# Patient Record
Sex: Male | Born: 1970 | Race: White | Hispanic: No | Marital: Married | Smoking: Never smoker
Health system: Southern US, Community
[De-identification: ages and names within clinical notes are randomized; demographics above are authoritative.]

## PROBLEM LIST (undated history)

## (undated) DIAGNOSIS — F32A Depression, unspecified: Secondary | ICD-10-CM

## (undated) DIAGNOSIS — K219 Gastro-esophageal reflux disease without esophagitis: Secondary | ICD-10-CM

## (undated) DIAGNOSIS — M2550 Pain in unspecified joint: Secondary | ICD-10-CM

## (undated) DIAGNOSIS — M549 Dorsalgia, unspecified: Secondary | ICD-10-CM

## (undated) DIAGNOSIS — R7303 Prediabetes: Secondary | ICD-10-CM

## (undated) HISTORY — DX: Depression, unspecified: F32.A

## (undated) HISTORY — DX: Dorsalgia, unspecified: M54.9

## (undated) HISTORY — DX: Gastro-esophageal reflux disease without esophagitis: K21.9

## (undated) HISTORY — PX: SHOULDER SURGERY: SHX246

## (undated) HISTORY — DX: Prediabetes: R73.03

## (undated) HISTORY — DX: Pain in unspecified joint: M25.50

---
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
## 1999-05-23 ENCOUNTER — Encounter: Payer: Self-pay | Admitting: Neurosurgery

## 1999-05-23 ENCOUNTER — Ambulatory Visit (HOSPITAL_COMMUNITY): Admission: RE | Admit: 1999-05-23 | Discharge: 1999-05-23 | Payer: Self-pay | Admitting: Neurosurgery

## 1999-06-07 ENCOUNTER — Encounter: Payer: Self-pay | Admitting: Neurosurgery

## 1999-06-07 ENCOUNTER — Ambulatory Visit (HOSPITAL_COMMUNITY): Admission: RE | Admit: 1999-06-07 | Discharge: 1999-06-07 | Payer: Self-pay | Admitting: Neurosurgery

## 2003-10-03 HISTORY — PX: ROTATOR CUFF REPAIR: SHX139

## 2007-10-03 HISTORY — PX: KNEE SURGERY: SHX244

## 2008-05-20 ENCOUNTER — Ambulatory Visit: Payer: Self-pay | Admitting: Orthopaedic Surgery

## 2008-06-23 DIAGNOSIS — H539 Unspecified visual disturbance: Secondary | ICD-10-CM | POA: Insufficient documentation

## 2008-06-23 DIAGNOSIS — R519 Headache, unspecified: Secondary | ICD-10-CM | POA: Insufficient documentation

## 2008-06-29 ENCOUNTER — Ambulatory Visit: Payer: Self-pay

## 2008-07-03 ENCOUNTER — Ambulatory Visit: Payer: Self-pay | Admitting: Orthopedic Surgery

## 2008-07-21 ENCOUNTER — Ambulatory Visit: Payer: Self-pay

## 2010-03-03 DIAGNOSIS — R5381 Other malaise: Secondary | ICD-10-CM | POA: Insufficient documentation

## 2010-03-03 DIAGNOSIS — M6281 Muscle weakness (generalized): Secondary | ICD-10-CM | POA: Insufficient documentation

## 2010-04-23 IMAGING — CT CT HEAD WITHOUT CONTRAST
2 series · 16 of 30 positions shown, 20 images · non-contrast
Comparison: none

REASON FOR EXAM: headaches blurred vision
COMMENTS:

[Series 2: without · axial · non-contrast · 0.39mm/px · z∈[-66,+54]mm · 13 of 29 slices shown, 17 images]
[im 3/29  brain]
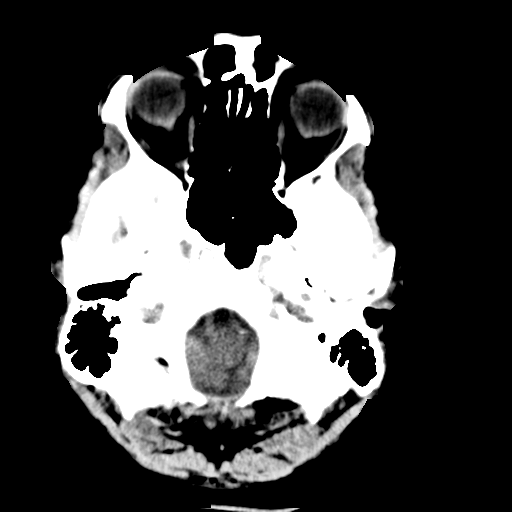
[im 3/29  bone]
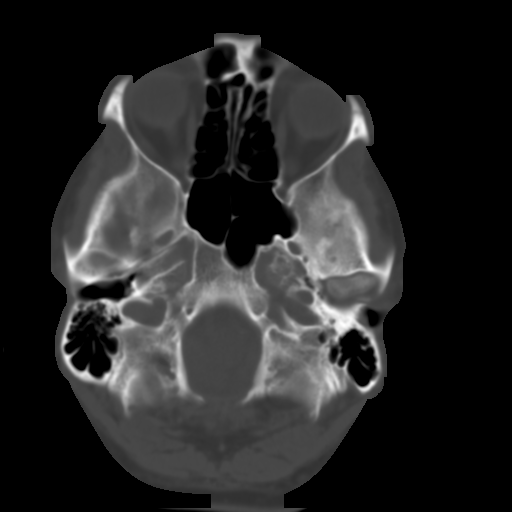
[im 5/29  brain]
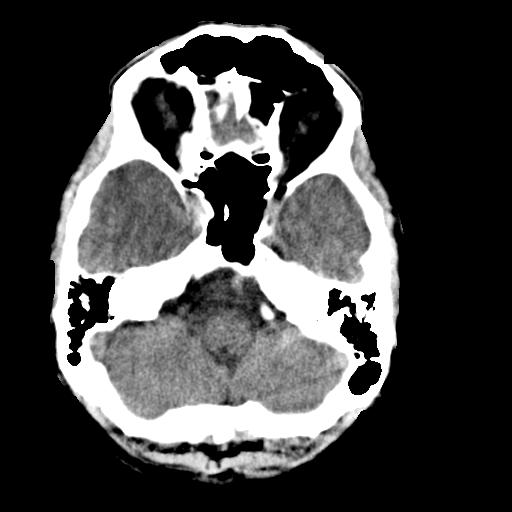
[im 7/29  brain]
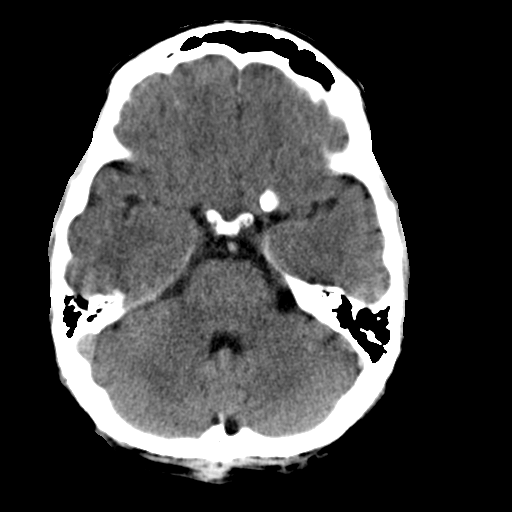
[im 9/29  brain]
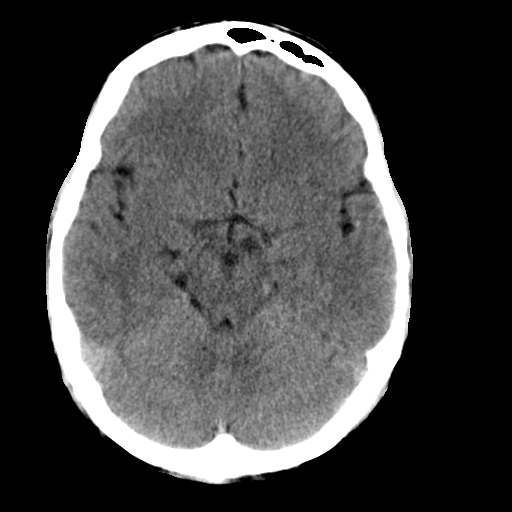
[im 11/29  brain]
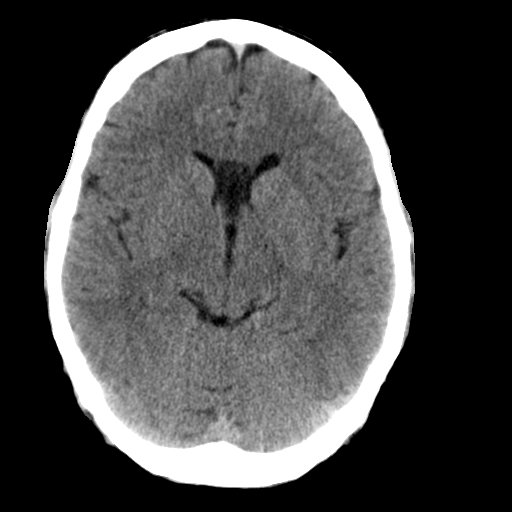
[im 11/29  bone]
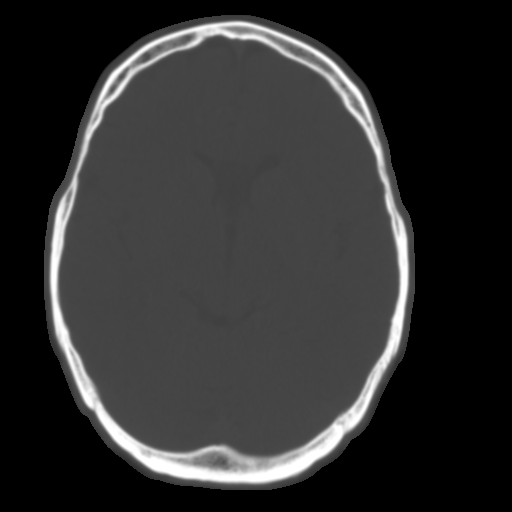
[im 13/29  brain]
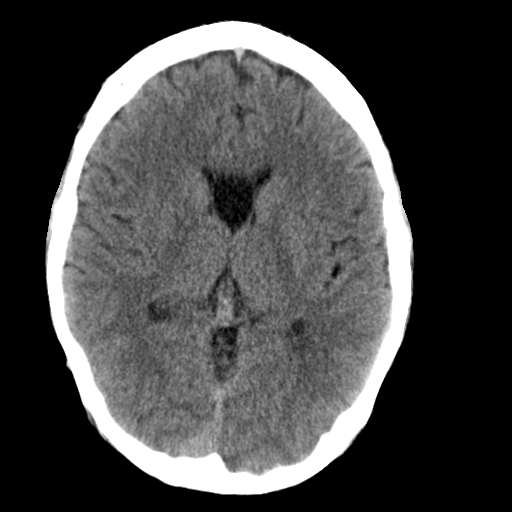
[im 15/29  brain]
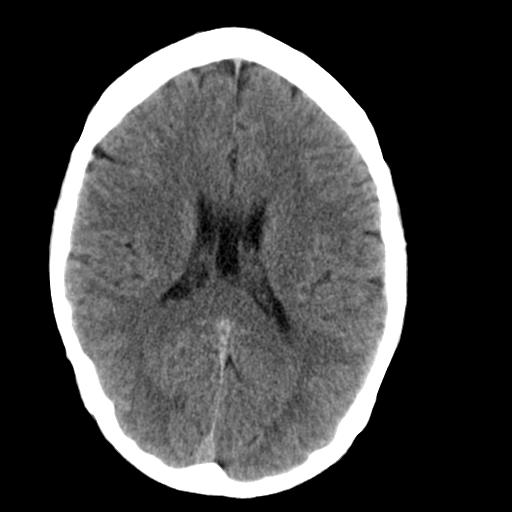
[im 17/29  brain]
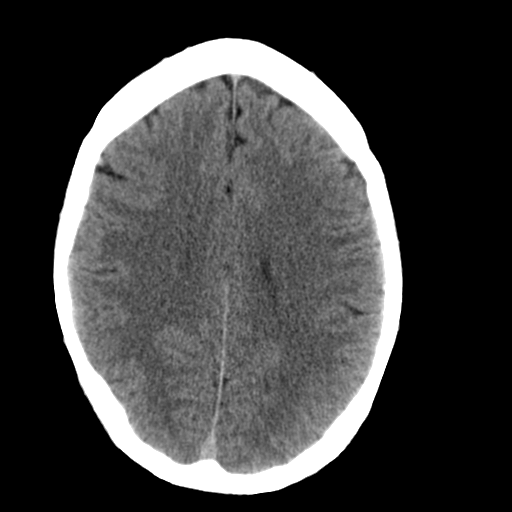
[im 19/29  brain]
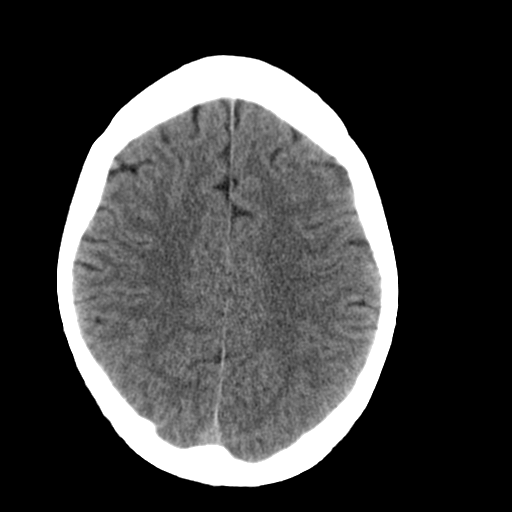
[im 19/29  bone]
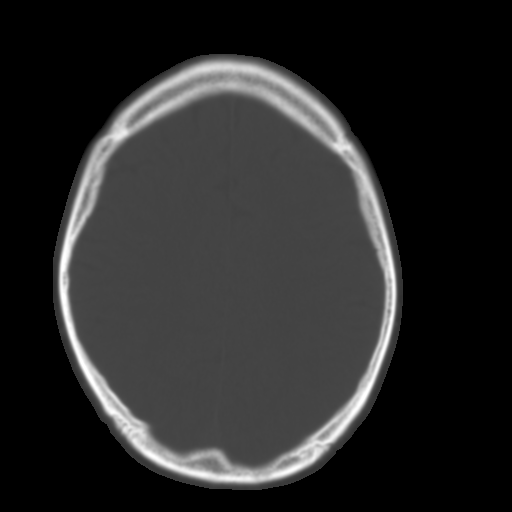
[im 21/29  brain]
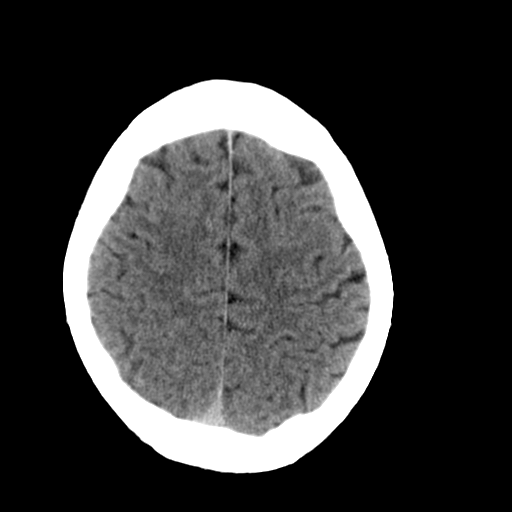
[im 23/29  brain]
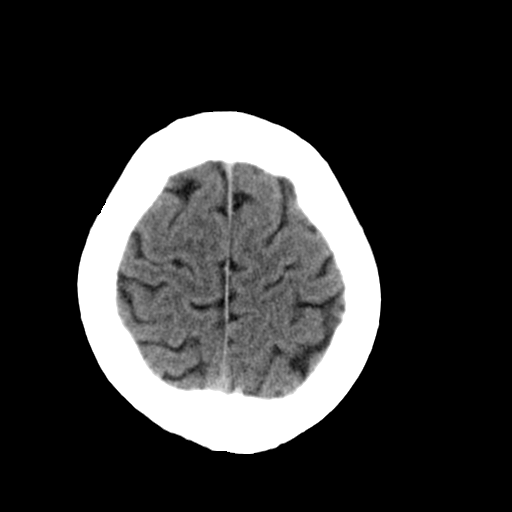
[im 25/29  brain]
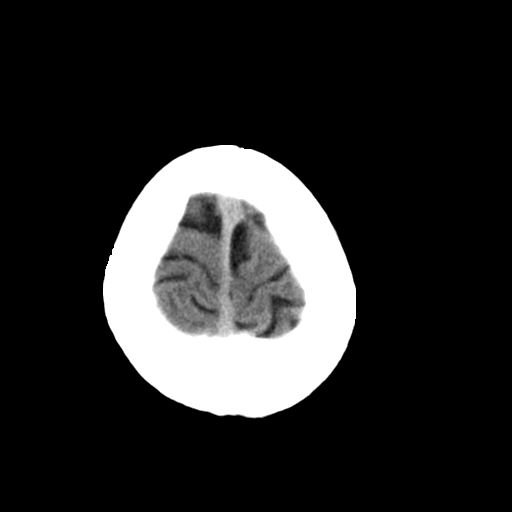
[im 27/29  brain]
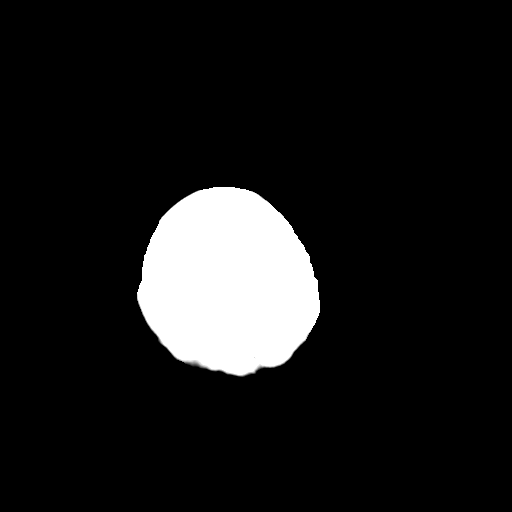
[im 27/29  bone]
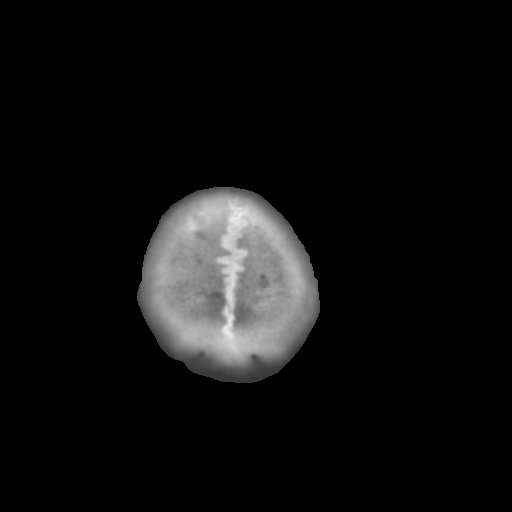

[Series 3: bone · axial · 0.39mm/px · z∈[-66,-26]mm · 3 of 29 slices shown]
[im 3/29  bone]
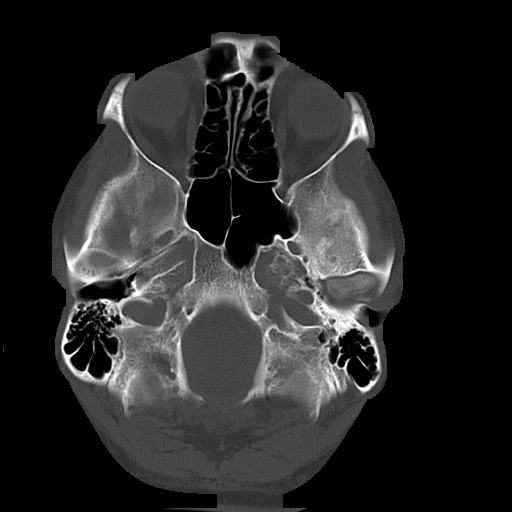
[im 7/29  bone]
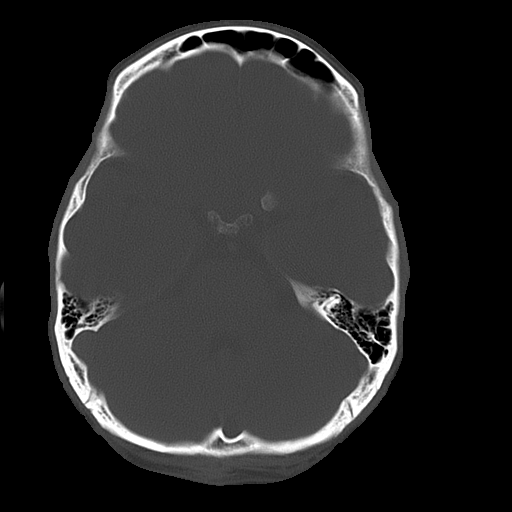
[im 11/29  bone]
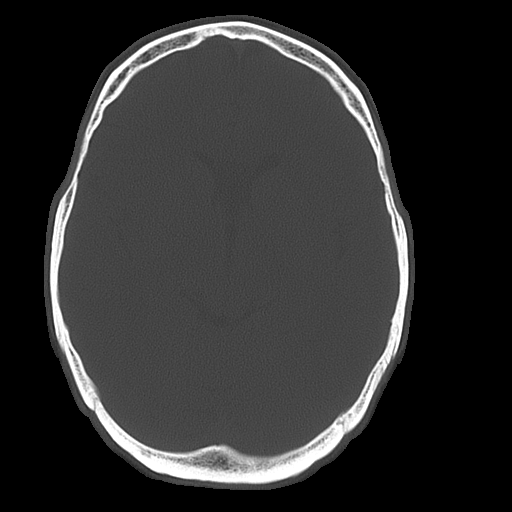

[16 of 30 positions shown; findings below may reference images not displayed]

PROCEDURE:     CT  - CT HEAD WITHOUT CONTRAST  - June 29, 2008  [DATE]

RESULT:     The ventricles are normal in size and position. There is a
mildly dilated cavum septum pellucidum present. There is no intracranial
hemorrhage, mass, or mass-effect. The cerebellum and brainstem are normal in
density. At bone window settings, the observed portions of the paranasal
sinuses are clear. There is no evidence of an acute skull fracture.
IMPRESSION: I see no acute intracranial abnormality. A mildly dilated cavum septum
pellucidum is present. Given that there are

visual disturbances presentt, further evaluation with MRI would be a useful
next step.

## 2013-07-25 ENCOUNTER — Ambulatory Visit: Payer: Self-pay | Admitting: Podiatry

## 2013-08-05 ENCOUNTER — Ambulatory Visit: Payer: Self-pay | Admitting: Podiatry

## 2015-03-10 ENCOUNTER — Ambulatory Visit (INDEPENDENT_AMBULATORY_CARE_PROVIDER_SITE_OTHER): Payer: BLUE CROSS/BLUE SHIELD | Admitting: Physician Assistant

## 2015-03-10 ENCOUNTER — Encounter: Payer: Self-pay | Admitting: Physician Assistant

## 2015-03-10 VITALS — BP 118/82 | HR 86 | Temp 98.4°F | Resp 14 | Wt 189.0 lb

## 2015-03-10 DIAGNOSIS — F419 Anxiety disorder, unspecified: Secondary | ICD-10-CM

## 2015-03-10 DIAGNOSIS — F1010 Alcohol abuse, uncomplicated: Secondary | ICD-10-CM | POA: Insufficient documentation

## 2015-03-10 DIAGNOSIS — R030 Elevated blood-pressure reading, without diagnosis of hypertension: Secondary | ICD-10-CM

## 2015-03-10 DIAGNOSIS — F32A Depression, unspecified: Secondary | ICD-10-CM | POA: Insufficient documentation

## 2015-03-10 DIAGNOSIS — F329 Major depressive disorder, single episode, unspecified: Secondary | ICD-10-CM | POA: Diagnosis not present

## 2015-03-10 DIAGNOSIS — R61 Generalized hyperhidrosis: Secondary | ICD-10-CM | POA: Diagnosis not present

## 2015-03-10 DIAGNOSIS — R5381 Other malaise: Secondary | ICD-10-CM | POA: Diagnosis not present

## 2015-03-10 DIAGNOSIS — R5383 Other fatigue: Secondary | ICD-10-CM | POA: Diagnosis not present

## 2015-03-10 DIAGNOSIS — IMO0001 Reserved for inherently not codable concepts without codable children: Secondary | ICD-10-CM | POA: Insufficient documentation

## 2015-03-10 DIAGNOSIS — R400 Somnolence: Secondary | ICD-10-CM | POA: Insufficient documentation

## 2015-03-10 MED ORDER — SERTRALINE HCL 50 MG PO TABS: 50.0000 mg | ORAL_TABLET | Freq: Every day | ORAL | Status: DC

## 2015-03-10 NOTE — Progress Notes (Signed)
Subjective:    Patient ID: Phillip Santana, male    DOB: 20-Mar-1971, 44 y.o.   MRN: [id number]  HPI Patient is a 44 year old male that comes to the office today with increased depression and anxiety. He reports that over the last month he has noticed that he has become more stressed and easily agitated with common issues he used to be able to handle normally. He reports he feels that he is having more difficulty getting to sleep than he used to and that he will get aggravated with his wife and daughter easier than he used to. He denies any true suicidal ideations but does mention that one time 2 weeks ago he did have the thought of running his truck into a tree but states that he would never fulfill this because of his wife and daughter.  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 Imogene Burnhen and Dr. Maryruth BunKapur.  He has been treated with Paxil and Lexapro and Zoloft in the past. He feels the Zoloft worked best for him. He also does have a history of excessive alcohol intake when he gets depressed. He states that he has not increased his alcohol intake and may drink 1-2 beers over the weekends currently. He has not had any new changes in his life, but states that his wife was involved in a motor vehicle accident a little while ago and has been permanently disabled with her back.  He has been working 2 jobs to try to facilitate the income of the household. He states that sometimes he feels like a failure to his family and that he cannot support them.    Review of Systems  Constitutional: Positive for appetite change (hungry all the time ).  Neurological: Negative for seizures and speech difficulty.  Psychiatric/Behavioral: Positive for suicidal ideas, sleep disturbance, dysphoric mood and agitation. Negative for hallucinations, behavioral problems, confusion, self-injury and decreased concentration. The patient is not nervous/anxious and is not hyperactive.        Objective:   Physical Exam   Constitutional: He is oriented to person, place, and time. He appears well-developed and well-nourished. No distress.  Cardiovascular: Normal rate, regular rhythm and normal heart sounds.  Exam reveals no gallop and no friction rub.   No murmur heard. Pulmonary/Chest: Effort normal and breath sounds normal. No respiratory distress. He has no wheezes. He has no rales. He exhibits no tenderness.  Neurological: He is alert and oriented to person, place, and time.  Psychiatric: His speech is normal. Judgment and thought content normal. His mood appears not anxious. His affect is not angry. He is slowed. Cognition and memory are normal. He exhibits a depressed mood. He expresses no suicidal plans.  Voiced having a thought of driving his truck into a tree about 2 weeks ago but states he would never do it because his wife and daughter and has not had any thoughts like that since that moment.  Vitals reviewed.         Assessment & Plan:  1. Clinical depression Will treat with zoloft again and refer to psychiatry and to a counselor.  He would like someone to better control his medications and would also feels he would benefit more from having a counselor to discuss his issues with.  F/U in 4 weeks.  - sertraline (ZOLOFT) 50 MG tablet; Take 1 tablet (50 mg total) by mouth daily.  Dispense: 30 tablet; Refill: 3 - Ambulatory referral to Psychiatry - Ambulatory referral to  Psychology  2. Anxiety More depressed mood than anxiety currently. See above.  - sertraline (ZOLOFT) 50 MG tablet; Take 1 tablet (50 mg total) by mouth daily.  Dispense: 30 tablet; Refill: 3 - Ambulatory referral to Psychiatry - Ambulatory referral to Psychology  3. Malaise and fatigue Most likely secondary to worsening depression.  Will treat depression and f/u in 4 weeks.  4. Unexplained night sweats States he has had "fever" in his back and night sweats for years now.  I discussed with him use of any weight loss or body  building supplements, which he denies, but did mention taking hydroxycut once recently for energy.  I discussed we will treat his depression first and re-evaluate in 4 weeks.

## 2015-03-10 NOTE — Patient Instructions (Signed)

## 2015-03-18 ENCOUNTER — Telehealth: Payer: Self-pay | Admitting: Physician Assistant

## 2015-03-18 DIAGNOSIS — F32A Depression, unspecified: Secondary | ICD-10-CM

## 2015-03-18 DIAGNOSIS — F329 Major depressive disorder, single episode, unspecified: Secondary | ICD-10-CM

## 2015-03-18 MED ORDER — BUPROPION HCL ER (XL) 150 MG PO TB24: 150.0000 mg | ORAL_TABLET | Freq: Every day | ORAL | Status: DC

## 2015-03-18 NOTE — Telephone Encounter (Signed)
Pt states the Rx sertraline (ZOLOFT) 50 MG tablet is not helping at all.  Pt states he is feeling very sleepy all the time.  Pt is requesting something different but would like to speak with you first.  [facility].  WC#[phone]/PN

## 2015-03-18 NOTE — Telephone Encounter (Signed)
Spoke with Onalee Hua.  Will try wellbutrin XL 150mg .  Discontinue zoloft.  Still awaiting psychiatry/psychology appt.  Will recheck in 4 weeks if wellbutrin does not cause side effects.

## 2015-04-12 ENCOUNTER — Encounter: Payer: Self-pay | Admitting: Physician Assistant

## 2015-04-12 ENCOUNTER — Ambulatory Visit (INDEPENDENT_AMBULATORY_CARE_PROVIDER_SITE_OTHER): Payer: BLUE CROSS/BLUE SHIELD | Admitting: Physician Assistant

## 2015-04-12 VITALS — BP 100/76 | HR 84 | Temp 98.2°F | Resp 16 | Wt 186.6 lb

## 2015-04-12 DIAGNOSIS — F419 Anxiety disorder, unspecified: Secondary | ICD-10-CM | POA: Diagnosis not present

## 2015-04-12 DIAGNOSIS — F329 Major depressive disorder, single episode, unspecified: Secondary | ICD-10-CM

## 2015-04-12 DIAGNOSIS — F32A Depression, unspecified: Secondary | ICD-10-CM

## 2015-04-12 DIAGNOSIS — R232 Flushing: Secondary | ICD-10-CM | POA: Insufficient documentation

## 2015-04-12 MED ORDER — VENLAFAXINE HCL ER 37.5 MG PO CP24: ORAL_CAPSULE | ORAL | Status: DC

## 2015-04-13 ENCOUNTER — Telehealth: Payer: Self-pay | Admitting: Physician Assistant

## 2015-04-13 DIAGNOSIS — F329 Major depressive disorder, single episode, unspecified: Secondary | ICD-10-CM

## 2015-04-13 DIAGNOSIS — F32A Depression, unspecified: Secondary | ICD-10-CM

## 2015-04-13 NOTE — Telephone Encounter (Signed)
Pt stated the Rx venlafaxine XR (EFFEXOR XR) 37.5 MG last night at bedtime and he states the hot flashes are worse.  Pt states he feels high, sleepy an numb in his throat and legs.  Pt is requesting something different.  [facility].  CB#[phone]/MW

## 2015-04-13 NOTE — Progress Notes (Signed)
   Subjective:    Patient ID: Phillip Santana, male    DOB: December 17, 1970, 44 y.o.   MRN: [id number]  HPI Patient is a 44 year old male that returns to the office today for a follow up of depression.  He has been taking Wellbutrin ER 300mg  daily.  He does feel this may be helping his symptoms some but he is still having hot flashes and is starting to have jealousy issues, which he has never had.  He does report he is sleeping better.    Review of Systems  Constitutional: Positive for appetite change (hungry all the time ).  Respiratory: Negative for chest tightness and shortness of breath.   Cardiovascular: Negative for chest pain and palpitations.  Neurological: Negative for dizziness, seizures, speech difficulty, weakness and headaches.  Psychiatric/Behavioral: Positive for sleep disturbance, dysphoric mood and agitation. Negative for suicidal ideas, hallucinations, behavioral problems, confusion, self-injury and decreased concentration. The patient is not nervous/anxious and is not hyperactive.        Objective:   Physical Exam  Constitutional: He is oriented to person, place, and time. He appears well-developed and well-nourished. No distress.  Cardiovascular: Normal rate, regular rhythm and normal heart sounds.  Exam reveals no gallop and no friction rub.   No murmur heard. Pulmonary/Chest: Effort normal and breath sounds normal. No respiratory distress. He has no wheezes. He has no rales. He exhibits no tenderness.  Neurological: He is alert and oriented to person, place, and time.  Skin: He is not diaphoretic.  Psychiatric: He has a normal mood and affect. His speech is normal and behavior is normal. Judgment and thought content normal. His mood appears not anxious. His affect is not angry. Cognition and memory are normal. He does not exhibit a depressed mood. He expresses no suicidal plans.  Vitals reviewed.         Assessment & Plan:  1. Depression Will switch to venlafaxine as  below to see if it controls his symptoms of depression and anger as well as the hot flashes.  Recheck in 4 weeks. - venlafaxine XR (EFFEXOR XR) 37.5 MG 24 hr capsule; Take one tablet at bedtime x 1 week, then 2 tablets at bedtime there after  Dispense: 60 capsule; Refill: 0  2. Anxiety Increased jealousy with wellbutrin.  Will try effexor as below.  Recheck in 4 weeks. - venlafaxine XR (EFFEXOR XR) 37.5 MG 24 hr capsule; Take one tablet at bedtime x 1 week, then 2 tablets at bedtime there after  Dispense: 60 capsule; Refill: 0  3. Hot flashes States he has hot flashes at night that make it hard for him to sleep.  Will try venlafaxine.  Recheck in 4 weeks.

## 2015-04-13 NOTE — Patient Instructions (Signed)
Depression Depression refers to feeling sad, low, down in the dumps, blue, gloomy, or empty. In general, there are two kinds of depression: 1. Normal sadness or normal grief. This kind of depression is one that we all feel from time to time after upsetting life experiences, such as the loss of a job or the ending of a relationship. This kind of depression is considered normal, is short lived, and resolves within a few days to 2 weeks. Depression experienced after the loss of a loved one (bereavement) often lasts longer than 2 weeks but normally gets better with time. 2. Clinical depression. This kind of depression lasts longer than normal sadness or normal grief or interferes with your ability to function at home, at work, and in school. It also interferes with your personal relationships. It affects almost every aspect of your life. Clinical depression is an illness. Symptoms of depression can also be caused by conditions other than those mentioned above, such as:  Physical illness. Some physical illnesses, including underactive thyroid gland (hypothyroidism), severe anemia, specific types of cancer, diabetes, uncontrolled seizures, heart and lung problems, strokes, and chronic pain are commonly associated with symptoms of depression.  Side effects of some prescription medicine. In some people, certain types of medicine can cause symptoms of depression.  Substance abuse. Abuse of alcohol and illicit drugs can cause symptoms of depression. SYMPTOMS Symptoms of normal sadness and normal grief include the following:  Feeling sad or crying for short periods of time.  Not caring about anything (apathy).  Difficulty sleeping or sleeping too much.  No longer able to enjoy the things you used to enjoy.  Desire to be by oneself all the time (social isolation).  Lack of energy or motivation.  Difficulty concentrating or remembering.  Change in appetite or weight.  Restlessness or  agitation. Symptoms of clinical depression include the same symptoms of normal sadness or normal grief and also the following symptoms:  Feeling sad or crying all the time.  Feelings of guilt or worthlessness.  Feelings of hopelessness or helplessness.  Thoughts of suicide or the desire to harm yourself (suicidal ideation).  Loss of touch with reality (psychotic symptoms). Seeing or hearing things that are not real (hallucinations) or having false beliefs about your life or the people around you (delusions and paranoia). DIAGNOSIS  The diagnosis of clinical depression is usually based on how bad the symptoms are and how long they have lasted. Your health care provider will also ask you questions about your medical history and substance use to find out if physical illness, use of prescription medicine, or substance abuse is causing your depression. Your health care provider may also order blood tests. TREATMENT  Often, normal sadness and normal grief do not require treatment. However, sometimes antidepressant medicine is given for bereavement to ease the depressive symptoms until they resolve. The treatment for clinical depression depends on how bad the symptoms are but often includes antidepressant medicine, counseling with a mental health professional, or both. Your health care provider will help to determine what treatment is best for you. Depression caused by physical illness usually goes away with appropriate medical treatment of the illness. If prescription medicine is causing depression, talk with your health care provider about stopping the medicine, decreasing the dose, or changing to another medicine. Depression caused by the abuse of alcohol or illicit drugs goes away when you stop using these substances. Some adults need professional help in order to stop drinking or using drugs. SEEK IMMEDIATE MEDICAL   CARE IF:  You have thoughts about hurting yourself or others.  You lose touch  with reality (have psychotic symptoms).  You are taking medicine for depression and have a serious side effect. FOR MORE INFORMATION  National Alliance on Mental Illness: www.nami.[employer]  National Institute of Mental Health: http://www.maynard.net/www.nimh.nih.gov Document Released: 09/15/2000 Document Revised: 02/02/2014 Document Reviewed: 12/18/2011 [facility] Patient Information 2015 [employer] [place]. This information is not intended to replace advice given to you by your health care provider. Make sure you discuss any questions you have with your health care provider.  Venlafaxine extended-release capsules What is this medicine? VENLAFAXINE(VEN la fax een) is used to treat depression, anxiety and panic disorder. This medicine may be used for other purposes; ask your health care provider or pharmacist if you have questions. COMMON BRAND NAME(S): Effexor XR What should I tell my health care provider before I take this medicine? They need to know if you have any of these conditions: -bleeding disorders -glaucoma -heart disease -high blood pressure -high cholesterol -kidney disease -liver disease -low levels of sodium in the blood -mania or bipolar disorder -seizures -suicidal thoughts, plans, or attempt; a previous suicide attempt by you or a family -take medicines that treat or prevent blood clots -thyroid disease -an unusual or allergic reaction to venlafaxine, desvenlafaxine, other medicines, foods, dyes, or preservatives -pregnant or trying to get pregnant -breast-feeding How should I use this medicine? Take this medicine by mouth with a full glass of water. Follow the directions on the prescription label. Do not cut, crush, or chew this medicine. Take it with food. If needed, the capsule may be carefully opened and the entire contents sprinkled on a spoonful of cool applesauce. Swallow the applesauce/pellet mixture right away without chewing and follow with a glass of water to ensure complete  swallowing of the pellets. Try to take your medicine at about the same time each day. Do not take your medicine more often than directed. Do not stop taking this medicine suddenly except upon the advice of your doctor. Stopping this medicine too quickly may cause serious side effects or your condition may worsen. A special MedGuide will be given to you by the pharmacist with each prescription and refill. Be sure to read this information carefully each time. Talk to your pediatrician regarding the use of this medicine in children. Special care may be needed. Overdosage: If you think you have taken too much of this medicine contact a poison control center or emergency room at once. NOTE: This medicine is only for you. Do not share this medicine with others. What if I miss a dose? If you miss a dose, take it as soon as you can. If it is almost time for your next dose, take only that dose. Do not take double or extra doses. What may interact with this medicine? Do not take this medicine with any of the following medications: -certain medicines for fungal infections like fluconazole, itraconazole, ketoconazole, posaconazole, voriconazole -cisapride -desvenlafaxine -dofetilide -dronedarone -duloxetine -levomilnacipran -linezolid -MAOIs like Carbex, Eldepryl, Marplan, Nardil, and Parnate -methylene blue (injected into a vein) -milnacipran -pimozide -thioridazine -ziprasidone This medicine may also interact with the following medications: -aspirin and aspirin-like medicines -certain medicines for depression, anxiety, or psychotic disturbances -certain medicines for migraine headaches like almotriptan, eletriptan, frovatriptan, naratriptan, rizatriptan, sumatriptan, zolmitriptan -certain medicines for sleep -certain medicines that treat or prevent blood clots like dalteparin, enoxaparin,  warfarin -cimetidine -clozapine -diuretics -fentanyl -furazolidone -indinavir -isoniazid -lithium -metoprolol -NSAIDS, medicines for pain and inflammation, like ibuprofen  or naproxen -other medicines that prolong the QT interval (cause an abnormal heart rhythm) -procarbazine -rasagiline -supplements like St. John's wort, kava kava, valerian -tramadol -tryptophan This list may not describe all possible interactions. Give your health care provider a list of all the medicines, herbs, non-prescription drugs, or dietary supplements you use. Also tell them if you smoke, drink alcohol, or use illegal drugs. Some items may interact with your medicine. What should I watch for while using this medicine? Tell your doctor if your symptoms do not get better or if they get worse. Visit your doctor or health care professional for regular checks on your progress. Because it may take several weeks to see the full effects of this medicine, it is important to continue your treatment as prescribed by your doctor. Patients and their families should watch out for new or worsening thoughts of suicide or depression. Also watch out for sudden changes in feelings such as feeling anxious, agitated, panicky, irritable, hostile, aggressive, impulsive, severely restless, overly excited and hyperactive, or not being able to sleep. If this happens, especially at the beginning of treatment or after a change in dose, call your health care professional. This medicine can cause an increase in blood pressure. Check with your doctor for instructions on monitoring your blood pressure while taking this medicine. You may get drowsy or dizzy. Do not drive, use machinery, or do anything that needs mental alertness until you know how this medicine affects you. Do not stand or sit up quickly, especially if you are an older patient. This reduces the risk of dizzy or fainting spells. Alcohol may interfere with the effect of this medicine.  Avoid alcoholic drinks. Your mouth may get dry. Chewing sugarless gum, sucking hard candy and drinking plenty of water will help. Contact your doctor if the problem does not go away or is severe. What side effects may I notice from receiving this medicine? Side effects that you should report to your doctor or health care professional as soon as possible: -allergic reactions like skin rash, itching or hives, swelling of the face, lips, or tongue -breathing problems -changes in vision -hallucination, loss of contact with reality -seizures -suicidal thoughts or other mood changes -trouble passing urine or change in the amount of urine -unusual bleeding or bruising Side effects that usually do not require medical attention (report to your doctor or health care professional if they continue or are bothersome): -change in sex drive or performance -constipation -increased sweating -loss of appetite -nausea -tremors -weight loss This list may not describe all possible side effects. Call your doctor for medical advice about side effects. You may report side effects to FDA at 1-800-FDA-1088. Where should I keep my medicine? Keep out of the reach of children. Store at a controlled temperature between 20 and 25 degrees C (68 degrees and 77 degrees F), in a dry place. Throw away any unused medicine after the expiration date. NOTE: This sheet is a summary. It may not cover all possible information. If you have questions about this medicine, talk to your doctor, pharmacist, or health care provider.  2015, Elsevier/[employer] Standard. (2013-04-15 12:46:03)

## 2015-04-13 NOTE — Telephone Encounter (Signed)
This is probably a reaction from the switch.  Tell him to take the 75 mg now to make sure it is not a withdrawal type symptom from the wellbutrin.  If symptoms are still occurring after a few more days then we can switch.  With one dose I dont feel we would see effectiveness or adverse effects .  I am worried this may be more a withdrawal effect.  Thanks! -JB

## 2015-04-14 MED ORDER — BUPROPION HCL ER (XL) 300 MG PO TB24: 300.0000 mg | ORAL_TABLET | Freq: Every day | ORAL | Status: DC

## 2015-04-14 NOTE — Telephone Encounter (Signed)
Talked with patient over the phone and discussed adverse effects of venlafaxine.  He would like to go back to wellbutrin 300mg  daily.  Agreed [facility]

## 2015-04-14 NOTE — Telephone Encounter (Signed)
Patient advised as directed below. Patient verbalized understanding. Patient is requesting a call back from you.

## 2015-04-19 ENCOUNTER — Ambulatory Visit: Payer: BLUE CROSS/BLUE SHIELD | Admitting: Psychiatry

## 2015-05-06 ENCOUNTER — Telehealth: Payer: Self-pay | Admitting: Physician Assistant

## 2015-05-06 DIAGNOSIS — F32A Depression, unspecified: Secondary | ICD-10-CM

## 2015-05-06 DIAGNOSIS — F329 Major depressive disorder, single episode, unspecified: Secondary | ICD-10-CM

## 2015-05-06 NOTE — Telephone Encounter (Signed)
Pt called and would like to talk to you about Wellbutrin.   He states he would like to change it to something else.    Thanks, Barth Kirks

## 2015-05-14 MED ORDER — BUPROPION HCL ER (XL) 450 MG PO TB24: 1.0000 | ORAL_TABLET | Freq: Every day | ORAL | Status: DC

## 2015-05-14 NOTE — Telephone Encounter (Signed)
Spoke with patient.  States wellbutrin is working ok, but just not strong enough on some days.  Will try increasing dose to see if he gets better relief.

## 2015-08-25 ENCOUNTER — Encounter: Payer: Self-pay | Admitting: Physician Assistant

## 2015-08-25 ENCOUNTER — Ambulatory Visit (INDEPENDENT_AMBULATORY_CARE_PROVIDER_SITE_OTHER): Payer: BLUE CROSS/BLUE SHIELD | Admitting: Physician Assistant

## 2015-08-25 VITALS — BP 128/96 | HR 98 | Temp 98.3°F | Resp 18 | Wt 190.6 lb

## 2015-08-25 DIAGNOSIS — G4709 Other insomnia: Secondary | ICD-10-CM | POA: Insufficient documentation

## 2015-08-25 DIAGNOSIS — F329 Major depressive disorder, single episode, unspecified: Secondary | ICD-10-CM

## 2015-08-25 DIAGNOSIS — G4700 Insomnia, unspecified: Secondary | ICD-10-CM

## 2015-08-25 DIAGNOSIS — F32A Depression, unspecified: Secondary | ICD-10-CM

## 2015-08-25 MED ORDER — TRAZODONE HCL 50 MG PO TABS: 50.0000 mg | ORAL_TABLET | Freq: Every evening | ORAL | Status: DC | PRN

## 2015-08-25 MED ORDER — CITALOPRAM HYDROBROMIDE 40 MG PO TABS: 40.0000 mg | ORAL_TABLET | Freq: Every day | ORAL | Status: DC

## 2015-08-25 NOTE — Progress Notes (Signed)
Subjective:    Patient ID: Phillip Santana, male    DOB: 1971-09-23, 44 y.o.   MRN: [id number]  HPI Phillip Santana is a 44 year old male that returns to the office today to follow-up his depression and worsening sleep disturbance. He states that approximately one month ago he did start a new job which requires him to work third shift. The child does pay better than his previous jobs but he is working more hours. He does still continue to work at [employer] but not as often as he did previously. He states that from working third shift he has noticed he'll come home and try to go to sleep and only sleep for a couple of hours and then wake up again. He states that since he has started this new job he has noticed he has become more irritable. He also states that his depression is worsening as well. He states that he does wish that he could die but denies being suicidal or homicidal. He states that he would never act upon the thought because of his wife and daughter. He also has been dealing with stress at home. His wife recently had back surgery in July and has been on disability. He has been on Wellbutrin 450 mg daily. He stated that the Wellbutrin didn't work well in the beginning that he feels that it is not controlling his symptoms any longer.  Review of Systems  Constitutional: Positive for fatigue. Negative for fever, chills and activity change.  Respiratory: Negative.   Cardiovascular: Negative.   Gastrointestinal: Negative.   Endocrine: Negative.   Genitourinary: Negative.   Musculoskeletal: Positive for back pain and arthralgias.  Neurological: Negative.   Psychiatric/Behavioral: Positive for sleep disturbance, dysphoric mood and agitation. Negative for suicidal ideas and self-injury. The patient is not nervous/anxious.       Objective:   Physical Exam  Constitutional: He appears well-developed and well-nourished. No distress.  Cardiovascular: Normal rate, regular rhythm and normal heart sounds.   Exam reveals no gallop and no friction rub.   No murmur heard. Pulmonary/Chest: Effort normal and breath sounds normal. No respiratory distress. He has no wheezes. He has no rales.  Skin: He is not diaphoretic.  Psychiatric: His speech is normal and behavior is normal. Judgment and thought content normal. His mood appears not anxious. His affect is not angry and not inappropriate. Cognition and memory are normal. He exhibits a depressed mood. He expresses no homicidal and no suicidal ideation. He expresses no suicidal plans and no homicidal plans.  Vitals reviewed.     Assessment & Plan:  1. Clinical depression I will switch his Wellbutrin to citalopram as below. I did advise him to take this before he goes to bed. I advised him to call the office if he has any adverse effects to this medication. If not I will see him back in 4 weeks to evaluate and see how he is doing with this new medication. - citalopram (CELEXA) 40 MG tablet; Take 1 tablet (40 mg total) by mouth daily.  Dispense: 30 tablet; Refill: 1  2. Insomnia Being that he is now working third shift and having difficulty sleeping during the day I will prescribe him trazodone as below. He has never tried a sleep aid before but was wishing to try something that would not make him completely incoherent. I did advise him to take the trazodone as needed. He will start with taking the Celexa by itself to see how well that helps him  sleep. He can then use the trazodone adjunctively with the Celexa as needed for sleep. I will follow-up with him in 4 weeks to see how he is doing with this regimen. - traZODone (DESYREL) 50 MG tablet; Take 1 tablet (50 mg total) by mouth at bedtime as needed for sleep.  Dispense: 30 tablet; Refill: 1

## 2015-08-25 NOTE — Patient Instructions (Signed)
Citalopram tablets  What is this medicine?  CITALOPRAM (sye TAL oh pram) is a medicine for depression.  This medicine may be used for other purposes; ask your health care provider or pharmacist if you have questions.  What should I tell my health care provider before I take this medicine?  They need to know if you have any of these conditions:  -bipolar disorder or a family history of bipolar disorder  -diabetes  -glaucoma  -heart disease  -history of irregular heartbeat  -kidney or liver disease  -low levels of magnesium or potassium in the blood  -receiving electroconvulsive therapy  -seizures (convulsions)  -suicidal thoughts or a previous suicide attempt  -an unusual or allergic reaction to citalopram, escitalopram, other medicines, foods, dyes, or preservatives  -pregnant or trying to become pregnant  -breast-feeding  How should I use this medicine?  Take this medicine by mouth with a glass of water. Follow the directions on the prescription label. You can take it with or without food. Take your medicine at regular intervals. Do not take your medicine more often than directed. Do not stop taking this medicine suddenly except upon the advice of your doctor. Stopping this medicine too quickly may cause serious side effects or your condition may worsen.  A special MedGuide will be given to you by the pharmacist with each prescription and refill. Be sure to read this information carefully each time.  Talk to your pediatrician regarding the use of this medicine in children. Special care may be needed.  Patients over 60 years old may have a stronger reaction and need a smaller dose.  Overdosage: If you think you have taken too much of this medicine contact a poison control center or emergency room at once.  NOTE: This medicine is only for you. Do not share this medicine with others.  What if I miss a dose?  If you miss a dose, take it as soon as you can. If it is almost time for your next dose, take only that dose.  Do not take double or extra doses.  What may interact with this medicine?  Do not take this medicine with any of the following medications:  -certain medicines for fungal infections like fluconazole, itraconazole, ketoconazole, posaconazole, voriconazole  -cisapride  -dofetilide  -dronedarone  -escitalopram  -linezolid  -MAOIs like Carbex, Eldepryl, Marplan, Nardil, and Parnate  -methylene blue (injected into a vein)  -pimozide  -thioridazine  -ziprasidone  This medicine may also interact with the following medications:  -alcohol  -aspirin and aspirin-like medicines  -carbamazepine  -certain medicines for depression, anxiety, or psychotic disturbances  -certain medicines for infections like chloroquine, clarithromycin, erythromycin, furazolidone, isoniazid, pentamidine  -certain medicines for migraine headaches like almotriptan, eletriptan, frovatriptan, naratriptan, rizatriptan, sumatriptan, zolmitriptan  -certain medicines for sleep  -certain medicines that treat or prevent blood clots like dalteparin, enoxaparin, warfarin  -cimetidine  -diuretics  -fentanyl  -lithium  -methadone  -metoprolol  -NSAIDs, medicines for pain and inflammation, like ibuprofen or naproxen  -omeprazole  -other medicines that prolong the QT interval (cause an abnormal heart rhythm)  -procarbazine  -rasagiline  -supplements like St. John's wort, kava kava, valerian  -tramadol  -tryptophan  This list may not describe all possible interactions. Give your health care provider a list of all the medicines, herbs, non-prescription drugs, or dietary supplements you use. Also tell them if you smoke, drink alcohol, or use illegal drugs. Some items may interact with your medicine.  What should I watch for while   using this medicine?  Tell your doctor if your symptoms do not get better or if they get worse. Visit your doctor or health care professional for regular checks on your progress. Because it may take several weeks to see the full effects of  this medicine, it is important to continue your treatment as prescribed by your doctor.  Patients and their families should watch out for new or worsening thoughts of suicide or depression. Also watch out for sudden changes in feelings such as feeling anxious, agitated, panicky, irritable, hostile, aggressive, impulsive, severely restless, overly excited and hyperactive, or not being able to sleep. If this happens, especially at the beginning of treatment or after a change in dose, call your health care professional.  You may get drowsy or dizzy. Do not drive, use machinery, or do anything that needs mental alertness until you know how this medicine affects you. Do not stand or sit up quickly, especially if you are an older patient. This reduces the risk of dizzy or fainting spells. Alcohol may interfere with the effect of this medicine. Avoid alcoholic drinks.  Your mouth may get dry. Chewing sugarless gum or sucking hard candy, and drinking plenty of water will help. Contact your doctor if the problem does not go away or is severe.  What side effects may I notice from receiving this medicine?  Side effects that you should report to your doctor or health care professional as soon as possible:  -allergic reactions like skin rash, itching or hives, swelling of the face, lips, or tongue  -chest pain  -confusion  -dizziness  -fast, irregular heartbeat  -fast talking and excited feelings or actions that are out of control  -feeling faint or lightheaded, falls  -hallucination, loss of contact with reality  -seizures  -shortness of breath  -suicidal thoughts or other mood changes  -unusual bleeding or bruising  Side effects that usually do not require medical attention (report to your doctor or health care professional if they continue or are bothersome):  -blurred vision  -change in appetite  -change in sex drive or performance  -headache  -increased sweating  -nausea  -trouble sleeping  This list may not describe all  possible side effects. Call your doctor for medical advice about side effects. You may report side effects to FDA at 1-800-FDA-1088.  Where should I keep my medicine?  Keep out of reach of children.  Store at room temperature between 15 and 30 degrees C (59 and 86 degrees F). Throw away any unused medicine after the expiration date.  NOTE: This sheet is a summary. It may not cover all possible information. If you have questions about this medicine, talk to your doctor, pharmacist, or health care provider.     © 2016, Elsevier/[employer] Standard. (2013-04-11 13:19:48)  Trazodone tablets  What is this medicine?  TRAZODONE (TRAZ oh done) is used to treat depression.  This medicine may be used for other purposes; ask your health care provider or pharmacist if you have questions.  What should I tell my health care provider before I take this medicine?  They need to know if you have any of these conditions:  -attempted suicide or thinking about it  -bipolar disorder  -bleeding problems  -glaucoma  -heart disease, or previous heart attack  -irregular heart beat  -kidney or liver disease  -low levels of sodium in the blood  -an unusual or allergic reaction to trazodone, other medicines, foods, dyes or preservatives  -pregnant or trying to get   pregnant  -breast-feeding  How should I use this medicine?  Take this medicine by mouth with a glass of water. Follow the directions on the prescription label. Take this medicine shortly after a meal or a light snack. Take your medicine at regular intervals. Do not take your medicine more often than directed. Do not stop taking this medicine suddenly except upon the advice of your doctor. Stopping this medicine too quickly may cause serious side effects or your condition may worsen.  A special MedGuide will be given to you by the pharmacist with each prescription and refill. Be sure to read this information carefully each time.  Talk to your pediatrician regarding the use of this medicine  in children. Special care may be needed.  Overdosage: If you think you have taken too much of this medicine contact a poison control center or emergency room at once.  NOTE: This medicine is only for you. Do not share this medicine with others.  What if I miss a dose?  If you miss a dose, take it as soon as you can. If it is almost time for your next dose, take only that dose. Do not take double or extra doses.  What may interact with this medicine?  Do not take this medicine with any of the following medications:  -certain medicines for fungal infections like fluconazole, itraconazole, ketoconazole, posaconazole, voriconazole  -cisapride  -dofetilide  -dronedarone  -linezolid  -MAOIs like Carbex, Eldepryl, Marplan, Nardil, and Parnate  -mesoridazine  -methylene blue (injected into a vein)  -pimozide  -saquinavir  -thioridazine  -ziprasidone  This medicine may also interact with the following medications:  -alcohol  -antiviral medicines for HIV or AIDS  -aspirin and aspirin-like medicines  -barbiturates like phenobarbital  -certain medicines for blood pressure, heart disease, irregular heart beat  -certain medicines for depression, anxiety, or psychotic disturbances  -certain medicines for migraine headache like almotriptan, eletriptan, frovatriptan, naratriptan, rizatriptan, sumatriptan, zolmitriptan  -certain medicines for seizures like carbamazepine and phenytoin  -certain medicines for sleep  -certain medicines that treat or prevent blood clots like dalteparin, enoxaparin, warfarin  -digoxin  -fentanyl  -lithium  -NSAIDS, medicines for pain and inflammation, like ibuprofen or naproxen  -other medicines that prolong the QT interval (cause an abnormal heart rhythm)  -rasagiline  -supplements like St. John's wort, kava kava, valerian  -tramadol  -tryptophan  This list may not describe all possible interactions. Give your health care provider a list of all the medicines, herbs, non-prescription drugs, or dietary  supplements you use. Also tell them if you smoke, drink alcohol, or use illegal drugs. Some items may interact with your medicine.  What should I watch for while using this medicine?  Tell your doctor if your symptoms do not get better or if they get worse. Visit your doctor or health care professional for regular checks on your progress. Because it may take several weeks to see the full effects of this medicine, it is important to continue your treatment as prescribed by your doctor.  Patients and their families should watch out for new or worsening thoughts of suicide or depression. Also watch out for sudden changes in feelings such as feeling anxious, agitated, panicky, irritable, hostile, aggressive, impulsive, severely restless, overly excited and hyperactive, or not being able to sleep. If this happens, especially at the beginning of treatment or after a change in dose, call your health care professional.  You may get drowsy or dizzy. Do not drive, use machinery, or do anything   that needs mental alertness until you know how this medicine affects you. Do not stand or sit up quickly, especially if you are an older patient. This reduces the risk of dizzy or fainting spells. Alcohol may interfere with the effect of this medicine. Avoid alcoholic drinks.  This medicine may cause dry eyes and blurred vision. If you wear contact lenses you may feel some discomfort. Lubricating drops may help. See your eye doctor if the problem does not go away or is severe.  Your mouth may get dry. Chewing sugarless gum, sucking hard candy and drinking plenty of water may help. Contact your doctor if the problem does not go away or is severe.  What side effects may I notice from receiving this medicine?  Side effects that you should report to your doctor or health care professional as soon as possible:  -allergic reactions like skin rash, itching or hives, swelling of the face, lips, or tongue  -fast, irregular heartbeat  -feeling  faint or lightheaded, falls  -painful erections or other sexual dysfunction  -suicidal thoughts or other mood changes  -trembling  Side effects that usually do not require medical attention (report to your doctor or health care professional if they continue or are bothersome):  -constipation  -headache  -muscle aches or pains  -nausea, vomiting  -unusually weak or tired  This list may not describe all possible side effects. Call your doctor for medical advice about side effects. You may report side effects to FDA at 1-800-FDA-1088.  Where should I keep my medicine?  Keep out of the reach of children.  Store at room temperature between 15 and 30 degrees C (59 to 86 degrees F). Protect from light. Keep container tightly closed. Throw away any unused medicine after the expiration date.  NOTE: This sheet is a summary. It may not cover all possible information. If you have questions about this medicine, talk to your doctor, pharmacist, or health care provider.     © 2016, [employer]. (2013-04-21 15:46:28)

## 2015-09-07 ENCOUNTER — Encounter: Payer: Self-pay | Admitting: Physician Assistant

## 2015-09-07 ENCOUNTER — Ambulatory Visit (INDEPENDENT_AMBULATORY_CARE_PROVIDER_SITE_OTHER): Payer: BLUE CROSS/BLUE SHIELD | Admitting: Physician Assistant

## 2015-09-07 VITALS — BP 130/88 | HR 90 | Temp 98.3°F | Resp 16 | Wt 188.6 lb

## 2015-09-07 DIAGNOSIS — R05 Cough: Secondary | ICD-10-CM

## 2015-09-07 DIAGNOSIS — J069 Acute upper respiratory infection, unspecified: Secondary | ICD-10-CM

## 2015-09-07 DIAGNOSIS — R059 Cough, unspecified: Secondary | ICD-10-CM

## 2015-09-07 DIAGNOSIS — R5381 Other malaise: Secondary | ICD-10-CM

## 2015-09-07 DIAGNOSIS — R5383 Other fatigue: Secondary | ICD-10-CM

## 2015-09-07 DIAGNOSIS — G4710 Hypersomnia, unspecified: Secondary | ICD-10-CM | POA: Diagnosis not present

## 2015-09-07 DIAGNOSIS — R61 Generalized hyperhidrosis: Secondary | ICD-10-CM | POA: Diagnosis not present

## 2015-09-07 DIAGNOSIS — R400 Somnolence: Secondary | ICD-10-CM

## 2015-09-07 MED ORDER — BENZONATATE 200 MG PO CAPS: 200.0000 mg | ORAL_CAPSULE | Freq: Three times a day (TID) | ORAL | Status: DC | PRN

## 2015-09-07 MED ORDER — AZITHROMYCIN 250 MG PO TABS: ORAL_TABLET | ORAL | Status: DC

## 2015-09-07 NOTE — Patient Instructions (Signed)
Upper Respiratory Infection, Adult Most upper respiratory infections (URIs) are a viral infection of the air passages leading to the lungs. A URI affects the nose, throat, and upper air passages. The most common type of URI is nasopharyngitis and is typically referred to as "the common cold." URIs run their course and usually go away on their own. Most of the time, a URI does not require medical attention, but sometimes a bacterial infection in the upper airways can follow a viral infection. This is called a secondary infection. Sinus and middle ear infections are common types of secondary upper respiratory infections. Bacterial pneumonia can also complicate a URI. A URI can worsen asthma and chronic obstructive pulmonary disease (COPD). Sometimes, these complications can require emergency medical care and may be life threatening.  CAUSES Almost all URIs are caused by viruses. A virus is a type of germ and can spread from one person to another.  RISKS FACTORS You may be at risk for a URI if:   You smoke.   You have chronic heart or lung disease.  You have a weakened defense (immune) system.   You are very young or very old.   You have nasal allergies or asthma.  You work in crowded or poorly ventilated areas.  You work in health care facilities or schools. SIGNS AND SYMPTOMS  Symptoms typically develop 2-3 days after you come in contact with a cold virus. Most viral URIs last 7-10 days. However, viral URIs from the influenza virus (flu virus) can last 14-18 days and are typically more severe. Symptoms may include:   Runny or stuffy (congested) nose.   Sneezing.   Cough.   Sore throat.   Headache.   Fatigue.   Fever.   Loss of appetite.   Pain in your forehead, behind your eyes, and over your cheekbones (sinus pain).  Muscle aches.  DIAGNOSIS  Your health care provider may diagnose a URI by:  Physical exam.  Tests to check that your symptoms are not due to  another condition such as:  Strep throat.  Sinusitis.  Pneumonia.  Asthma. TREATMENT  A URI goes away on its own with time. It cannot be cured with medicines, but medicines may be prescribed or recommended to relieve symptoms. Medicines may help:  Reduce your fever.  Reduce your cough.  Relieve nasal congestion. HOME CARE INSTRUCTIONS   Take medicines only as directed by your health care provider.   Gargle warm saltwater or take cough drops to comfort your throat as directed by your health care provider.  Use a warm mist humidifier or inhale steam from a shower to increase air moisture. This may make it easier to breathe.  Drink enough fluid to keep your urine clear or pale yellow.   Eat soups and other clear broths and maintain good nutrition.   Rest as needed.   Return to work when your temperature has returned to normal or as your health care provider advises. You may need to stay home longer to avoid infecting others. You can also use a face mask and careful hand washing to prevent spread of the virus.  Increase the usage of your inhaler if you have asthma.   Do not use any tobacco products, including cigarettes, chewing tobacco, or electronic cigarettes. If you need help quitting, ask your health care provider. PREVENTION  The best way to protect yourself from getting a cold is to practice good hygiene.   Avoid oral or hand contact with people with cold   symptoms.   Wash your hands often if contact occurs.  There is no clear evidence that vitamin C, vitamin E, echinacea, or exercise reduces the chance of developing a cold. However, it is always recommended to get plenty of rest, exercise, and practice good nutrition.  SEEK MEDICAL CARE IF:   You are getting worse rather than better.   Your symptoms are not controlled by medicine.   You have chills.  You have worsening shortness of breath.  You have brown or red mucus.  You have yellow or brown nasal  discharge.  You have pain in your face, especially when you bend forward.  You have a fever.  You have swollen neck glands.  You have pain while swallowing.  You have white areas in the back of your throat. SEEK IMMEDIATE MEDICAL CARE IF:   You have severe or persistent:  Headache.  Ear pain.  Sinus pain.  Chest pain.  You have chronic lung disease and any of the following:  Wheezing.  Prolonged cough.  Coughing up blood.  A change in your usual mucus.  You have a stiff neck.  You have changes in your:  Vision.  Hearing.  Thinking.  Mood. MAKE SURE YOU:   Understand these instructions.  Will watch your condition.  Will get help right away if you are not doing well or get worse.   This information is not intended to replace advice given to you by your health care provider. Make sure you discuss any questions you have with your health care provider.   Document Released: 03/14/2001 Document Revised: 02/02/2015 Document Reviewed: 12/24/2013 Elsevier Interactive Patient Education 2016 [employer]  

## 2015-09-07 NOTE — Progress Notes (Signed)
Patient: Phillip Santana Male    DOB: [birth year]   44 y.o.   MRN: [id number] Visit Date: 09/07/2015  Today's Provider: Margaretann Loveless, PA-C   Chief Complaint  Patient presents with  . URI   Subjective:    URI  This is a new problem. The current episode started in the past 7 days. The problem has been gradually worsening. Associated symptoms include congestion, coughing, a plugged ear sensation, rhinorrhea, sneezing and a sore throat. Pertinent negatives include no sinus pain or wheezing. Phillip Santana has tried decongestant and antihistamine (Mucinex; Benadryl) for the symptoms. The treatment provided no relief.       No Known Allergies Previous Medications   CITALOPRAM (CELEXA) 40 MG TABLET    Take 1 tablet (40 mg total) by mouth daily.   LYSINE 1000 MG TABS    Take 1 tablet by mouth daily.   MISC NATURAL PRODUCTS (ADRENAL PO)    Take 1 tablet by mouth daily. Fatigue fighter   TRAZODONE (DESYREL) 50 MG TABLET    Take 1 tablet (50 mg total) by mouth at bedtime as needed for sleep.    Review of Systems  Constitutional: Positive for chills and fatigue. Negative for fever.  HENT: Positive for congestion, postnasal drip, rhinorrhea, sneezing, sore throat and voice change.        Bodyache  Eyes: Negative.   Respiratory: Positive for cough. Negative for chest tightness, shortness of breath and wheezing.   Cardiovascular: Negative.   Gastrointestinal: Negative.   All other systems reviewed and are negative.   Social History  Substance Use Topics  . Smoking status: Never Smoker   . Smokeless tobacco: Not on file  . Alcohol Use: 0.0 oz/week    0 Standard drinks or equivalent per week     Comment: occasionally   Objective:   BP 130/88 mmHg  Pulse 90  Temp(Src) 98.3 F (36.8 C) (Oral)  Resp 16  Wt 188 lb 9.6 oz (85.548 kg)  SpO2 96%  Physical Exam  Constitutional: Phillip Santana appears well-developed and well-nourished. No distress.  HENT:  Head: Normocephalic and atraumatic.    Right Ear: Hearing, tympanic membrane, external ear and ear canal normal.  Left Ear: Hearing, tympanic membrane, external ear and ear canal normal.  Nose: Mucosal edema and rhinorrhea present. Right sinus exhibits no maxillary sinus tenderness and no frontal sinus tenderness. Left sinus exhibits no maxillary sinus tenderness and no frontal sinus tenderness.  Mouth/Throat: Uvula is midline, oropharynx is clear and moist and mucous membranes are normal. No oropharyngeal exudate (postnasal drainage visualized) or posterior oropharyngeal erythema.  Eyes: Conjunctivae and EOM are normal. Pupils are equal, round, and reactive to light. Right eye exhibits no discharge. Left eye exhibits no discharge.  Neck: Normal range of motion. Neck supple. No tracheal deviation present. No Brudzinski's sign and no Kernig's sign noted. No thyromegaly present.  Cardiovascular: Normal rate, regular rhythm and normal heart sounds.  Exam reveals no gallop and no friction rub.   No murmur heard. Pulmonary/Chest: Effort normal and breath sounds normal. No stridor. No respiratory distress. Phillip Santana has no wheezes. Phillip Santana has no rales. Phillip Santana exhibits no tenderness.  Lymphadenopathy:    Phillip Santana has no cervical adenopathy.  Skin: Skin is warm and dry. Phillip Santana is not diaphoretic.  Vitals reviewed.       Assessment & Plan:     1. Cough Worsening. Is not affecting sleep at this time. States that the Mucinex did help break up the  congestion in his chest and Phillip Santana was able to cough some of this up but that Phillip Santana is having increasing cough at work when Phillip Santana gets hot. Phillip Santana would like a cough suppressant to have to take while Phillip Santana is working to prevent cough. I will prescribe Tessalon Perles as below. Phillip Santana is to call the office if symptoms fail to improve or worsen. - benzonatate (TESSALON) 200 MG capsule; Take 1 capsule (200 mg total) by mouth 3 (three) times daily as needed for cough.  Dispense: 30 capsule; Refill: 0  2. Malaise and fatigue Phillip Santana has had increasing  fatigue over the last year. Phillip Santana states that the Wellbutrin did help the fatigue some but then his symptoms started to come back. Phillip Santana states now the Celexa has helped but with the upper respiratory infection and Phillip Santana is starting to feel more fatigued again. Phillip Santana also is now working third shift and is trying to get used to this routine. Along with the increased fatigue Phillip Santana has also had daytime somnolence and increasing night sweats. Phillip Santana has had the night sweats over the last year. We have previously checked his thyroid, blood counts and metabolic panels which all were normal. Phillip Santana would like to have his testosterone checked for these symptoms to see if low testosterone may be causing the symptoms. I will check his testosterone levels and follow-up with him pending these results. Phillip Santana is to call the office if symptoms worsen. - Testosterone  3. Daytime somnolence See above medical treatment plan under malaise and fatigue. - Testosterone  4. Night sweats See above medical treatment plan under malaise and fatigue. - Testosterone  5. Upper respiratory infection Worsening. I will treat the upper respiratory infection with a Z-Pak as below. His symptoms have been going on for over a week now without much improvement. Phillip Santana has been taking Mucinex and Benadryl over-the-counter. I also have prescribed Tessalon Perles as above for his cough. Phillip Santana is to call the office if symptoms fail to improve or persist with treatment. - azithromycin (ZITHROMAX) 250 MG tablet; Take 2 tablets PO on day one, and one tablet PO daily thereafter until completed.  Dispense: 6 tablet; Refill: 0       Margaretann LovelessJennifer M Burnette, PA-C  [facility]

## 2015-09-13 ENCOUNTER — Telehealth: Payer: Self-pay | Admitting: Physician Assistant

## 2015-09-13 NOTE — Telephone Encounter (Signed)
Pt lost his order for testosrone labs . Can you reprint and have ready this afternoon for him to pick up..this afternoon  Also the citalopram (CELEXA) 40 MG tablet  Medication is making him sleepy.  She said he said it works as far as calming him down but it makes him sleepy.  You can call his wife back at [phone]  Thank sTeri

## 2015-09-13 NOTE — Telephone Encounter (Signed)
Please review. Thanks!  

## 2015-09-13 NOTE — Telephone Encounter (Signed)
Yes I will have this up front for him to pick up, and as for the celexa is he taking it before his bedtime? If not he can take it then.  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  Thanks.

## 2015-09-13 NOTE — Telephone Encounter (Signed)
Tried calling patient, unable to leave vm. Will try again later.

## 2015-09-14 NOTE — Telephone Encounter (Signed)
Tried calling patient, unable to vm. Will try again later.  Thanks,  -Zamara Cozad

## 2015-09-15 NOTE — Telephone Encounter (Signed)
Patient advised as directed below. Patient verbalized  Understanding. Patient states the last 2 days he has been okay with the Celexa not making him sleepy. Advised patient to call back if any more problems with medication.

## 2015-09-22 ENCOUNTER — Ambulatory Visit (INDEPENDENT_AMBULATORY_CARE_PROVIDER_SITE_OTHER): Payer: BLUE CROSS/BLUE SHIELD | Admitting: Physician Assistant

## 2015-09-22 ENCOUNTER — Encounter: Payer: Self-pay | Admitting: Physician Assistant

## 2015-09-22 VITALS — BP 120/70 | HR 93 | Temp 98.3°F | Resp 16 | Wt 190.2 lb

## 2015-09-22 DIAGNOSIS — F32A Depression, unspecified: Secondary | ICD-10-CM

## 2015-09-22 DIAGNOSIS — G4700 Insomnia, unspecified: Secondary | ICD-10-CM

## 2015-09-22 DIAGNOSIS — F329 Major depressive disorder, single episode, unspecified: Secondary | ICD-10-CM

## 2015-09-22 NOTE — Patient Instructions (Signed)
Testosterone Testosterone is a hormone made by the male's testicles and by the adrenal glands, which are a pair of glands on top of the kidneys. Starting at puberty, testosterone stimulates the development of secondary sex characteristics. This includes a deeper voice, growth of muscles and body hair, and penis enlargement.  Females also produce testosterone in both the adrenal glands and ovaries. A male's body converts testosterone into estradiol, the main male sex hormone. An abnormal level of testosterone can cause health issues in both males and females. You may have this test if your health care provider suspects that an abnormal testosterone level is causing or contributing to other health problems. In males, symptoms of an abnormal testosterone level include:  Infertility.  Erectile dysfunction.  Delayed puberty or premature puberty. In females, symptoms of an abnormally high testosterone level include:  Infertility.  Polycystic ovarian syndrome (PCOS).  Developing masculine features (virilization). This test requires a blood sample taken from a vein in your arm or hand. The sample for this test is usually collected in the morning. The amount of testosterone in your blood is highest at that time. RESULTS It is your responsibility to obtain your test results. Ask the lab or department performing the test when and how you will get your results. Contact your health care provider to discuss any questions you have about your results.  The result of a blood test for testosterone will be given as a range of values. A testosterone level that is outside the normal range may indicate a health problem. Testosterone is measured in nanograms per deciliter (ng/dL). Range of Normal Values Ranges for normal values may vary among different labs and hospitals. You should always check with your health care provider after having lab work or other tests done to discuss whether your values are considered  within normal limits. Normal levels of total testosterone are as follows:  Male:  7 months to 43 years old: less than 30 ng/dL.  10-13 years old: less than 300 ng/dL.  14-15 years old: 170-540 ng/dL.  16-19 years old: 250-910 ng/dL.  43 years old and over: 280-1,080 ng/dL.  Male:  7 months to 43 years old: less than 30 ng/dL.  10-13 years old: less than 40 ng/dL.  14-15 years old: less than 60 ng/dL.  16-19 years old: less than 70 ng/dL.  43 years old and over: less than 70 ng/dL. Meaning of Results Outside Normal Value Ranges A testosterone level that is too low or too high can indicate a number of health problems. In males:  A high testosterone level can occur if you:  Have certain types of tumors.  Have an overactive thyroid gland (hyperthyroidism).  Use anabolic steroids.  Are starting puberty early (precocious puberty).  Have an inherited disorder that affects the adrenal glands (congenital adrenal hyperplasia).  A low testosterone level can occur if you:  Have certain genetic diseases.  Have had certain viral infections, such as mumps.  Have pituitary disease.  Have had an injury to the testicles.  Are an alcoholic. In females:  A high testosterone level can occur if you have:  Certain types of tumors.  An inherited disorder that affects certain cells in the adrenal glands (congenital adrenocortical hyperplasia).  PCOS.  A low testosterone level does not cause health problems. Discuss the results of your testosterone test with your health care provider. Your health care provider will use the results of this test and other tests to make a diagnosis.   This information   is not intended to replace advice given to you by your health care provider. Make sure you discuss any questions you have with your health care provider.   Document Released: 10/05/2004 Document Revised: 10/09/2014 Document Reviewed: 01/14/2014 Elsevier Interactive Patient  Education 2016 Elsevier Inc. Insomnia Insomnia is a sleep disorder that makes it difficult to fall asleep or to stay asleep. Insomnia can cause tiredness (fatigue), low energy, difficulty concentrating, mood swings, and poor performance at work or school.  There are three different ways to classify insomnia:  Difficulty falling asleep.  Difficulty staying asleep.  Waking up too early in the morning. Any type of insomnia can be long-term (chronic) or short-term (acute). Both are common. Short-term insomnia usually lasts for three months or less. Chronic insomnia occurs at least three times a week for longer than three months. CAUSES  Insomnia may be caused by another condition, situation, or substance, such as:  Anxiety.  Certain medicines.  Gastroesophageal reflux disease (GERD) or other gastrointestinal conditions.  Asthma or other breathing conditions.  Restless legs syndrome, sleep apnea, or other sleep disorders.  Chronic pain.  Menopause. This may include hot flashes.  Stroke.  Abuse of alcohol, tobacco, or illegal drugs.  Depression.  Caffeine.   Neurological disorders, such as Alzheimer disease.  An overactive thyroid (hyperthyroidism). The cause of insomnia may not be known. RISK FACTORS Risk factors for insomnia include:  Gender. Women are more commonly affected than men.  Age. Insomnia is more common as you get older.  Stress. This may involve your professional or personal life.  Income. Insomnia is more common in people with lower income.  Lack of exercise.   Irregular work schedule or night shifts.  Traveling between different time zones. SIGNS AND SYMPTOMS If you have insomnia, trouble falling asleep or trouble staying asleep is the main symptom. This may lead to other symptoms, such as:  Feeling fatigued.  Feeling nervous about going to sleep.  Not feeling rested in the morning.  Having trouble concentrating.  Feeling irritable,  anxious, or depressed. TREATMENT  Treatment for insomnia depends on the cause. If your insomnia is caused by an underlying condition, treatment will focus on addressing the condition. Treatment may also include:   Medicines to help you sleep.  Counseling or therapy.  Lifestyle adjustments. HOME CARE INSTRUCTIONS   Take medicines only as directed by your health care provider.  Keep regular sleeping and waking hours. Avoid naps.  Keep a sleep diary to help you and your health care provider figure out what could be causing your insomnia. Include:   When you sleep.  When you wake up during the night.  How well you sleep.   How rested you feel the next day.  Any side effects of medicines you are taking.  What you eat and drink.   Make your bedroom a comfortable place where it is easy to fall asleep:  Put up shades or special blackout curtains to block light from outside.  Use a white noise machine to block noise.  Keep the temperature cool.   Exercise regularly as directed by your health care provider. Avoid exercising right before bedtime.  Use relaxation techniques to manage stress. Ask your health care provider to suggest some techniques that may work well for you. These may include:  Breathing exercises.  Routines to release muscle tension.  Visualizing peaceful scenes.  Cut back on alcohol, caffeinated beverages, and cigarettes, especially close to bedtime. These can disrupt your sleep.  Do not overeat  or eat spicy foods right before bedtime. This can lead to digestive discomfort that can make it hard for you to sleep.  Limit screen use before bedtime. This includes:  Watching TV.  Using your smartphone, tablet, and computer.  Stick to a routine. This can help you fall asleep faster. Try to do a quiet activity, brush your teeth, and go to bed at the same time each night.  Get out of bed if you are still awake after 15 minutes of trying to sleep. Keep the  lights down, but try reading or doing a quiet activity. When you feel sleepy, go back to bed.  Make sure that you drive carefully. Avoid driving if you feel very sleepy.  Keep all follow-up appointments as directed by your health care provider. This is important. SEEK MEDICAL CARE IF:   You are tired throughout the day or have trouble in your daily routine due to sleepiness.  You continue to have sleep problems or your sleep problems get worse. SEEK IMMEDIATE MEDICAL CARE IF:   You have serious thoughts about hurting yourself or someone else.   This information is not intended to replace advice given to you by your health care provider. Make sure you discuss any questions you have with your health care provider.   Document Released: 09/15/2000 Document Revised: 06/09/2015 Document Reviewed: 06/19/2014 Elsevier Interactive Patient Education [employer]2016 Elsevier Inc.

## 2015-09-22 NOTE — Progress Notes (Signed)
Patient: Phillip Santana Male    DOB: 02/17/1971   44 y.o.   MRN: [id number] Visit Date: 09/22/2015  Today's Provider: Margaretann LovelessJennifer M Yuvraj Pfeifer, PA-C   Chief Complaint  Patient presents with  . Follow-up    Depression and Insomnia   Subjective:    HPI  Insomnia Follow up  He presents today for follow up of insomnia. He was last seen for this 4 weeks ago. Management changes included none. He is not having adverse reaction from treatment. Insomnia is getting better.  He has difficulty FALLING asleep. He has difficulty STAYING asleep sometimes.  He is taking new medications: Trazodone as needed. He is not drinking alcohol to help sleep.  Depression: Patient is here following on Depression. He complains of depressed mood, per patient the anger that he was feeling was helped by the Citalopram.  He denies current suicidal and homicidal plan or intent.   Family history significant for depression.  Risk factors: positive family history in  both side of his family. He complains of the following side effects from the treatment: Drowsiness. In the last office visit patient got switch from Wellbutrin to citalopram.Patient was advised to take this before he goes to bed. Patient is not taking the Citalopram due to the drowsiness side effect. This is the third day and patient reports feeling not drowsy.     No Known Allergies Previous Medications   CITALOPRAM (CELEXA) 40 MG TABLET    Take 1 tablet (40 mg total) by mouth daily.   L-ARGININE MAXIMUM STRENGTH PO    Take by mouth daily.   LYSINE 1000 MG TABS    Take 1 tablet by mouth daily.   MISC NATURAL PRODUCTS (ADRENAL PO)    Take 1 tablet by mouth daily. Fatigue fighter   TRAZODONE (DESYREL) 50 MG TABLET    Take 1 tablet (50 mg total) by mouth at bedtime as needed for sleep.    Review of Systems  Constitutional: Negative.   HENT: Negative.   Eyes: Negative.   Respiratory: Negative.   Cardiovascular: Negative.   Gastrointestinal:  Negative.   Psychiatric/Behavioral: Positive for sleep disturbance.    Social History  Substance Use Topics  . Smoking status: Never Smoker   . Smokeless tobacco: Not on file  . Alcohol Use: 0.0 oz/week    0 Standard drinks or equivalent per week     Comment: occasionally   Objective:   BP 120/70 mmHg  Pulse 93  Temp(Src) 98.3 F (36.8 C) (Oral)  Resp 16  Wt 190 lb 3.2 oz (86.274 kg)  Physical Exam  Constitutional: He appears well-developed and well-nourished. No distress.  HENT:  Head: Normocephalic and atraumatic.  Neck: Normal range of motion. Neck supple.  Cardiovascular: Normal rate, regular rhythm and normal heart sounds.  Exam reveals no gallop and no friction rub.   No murmur heard. Pulmonary/Chest: Effort normal and breath sounds normal. No respiratory distress. He has no wheezes. He has no rales.  Skin: He is not diaphoretic.  Psychiatric: He has a normal mood and affect. His behavior is normal. Judgment and thought content normal.  Vitals reviewed.       Assessment & Plan:     1. Clinical depression  he states that at this time he is feeling better and is getting slightly more rest. He would like to tried to go without any medication at this time. He is also using a new supplement that is supposed to help with  energy. He states that this has been working well as well as he has increase going to the gym again. He states that this is also helping his mood , energy and rest. He is to call the office if he starts developing depressed mood , irritability and anger again. If symptoms return and we did discuss possibly trying Lexapro next time as he has not tried that one yet.  2. Insomnia Improving with trazodone. He states he only takes as needed and when he knows that he will be able to give himself at least 7-8 hours of sleep. He states that he uses them approximately 1-2 times per week. He does think that his change in lifestyle with increasing physical activity  again is also helping his ability to fall asleep. He states his most common issue with sleeping is that he does have difficulty falling asleep and staying asleep for more than 2 or 3 hours. He feels this is most likely due to working 2-3 jobs for so long and he is just not adjusted his sleep schedule yet. He is to call the office if symptoms fail to improve or worsen.       Margaretann Loveless, PA-C  [facility]

## 2015-09-24 ENCOUNTER — Telehealth: Payer: Self-pay

## 2015-09-24 LAB — TESTOSTERONE: Testosterone: >1500 ng/dL — ABNORMAL HIGH (ref 348–1197)

## 2015-09-24 NOTE — Telephone Encounter (Signed)
Pt advised.   Thanks,   -Hanifah Royse  

## 2015-09-24 NOTE — Telephone Encounter (Signed)
-----   Message from Margaretann LovelessJennifer M Burnette, PA-C sent at 09/24/2015  8:16 AM EST ----- Testosterone is normal.

## 2015-11-22 ENCOUNTER — Telehealth: Payer: Self-pay | Admitting: Physician Assistant

## 2015-11-22 ENCOUNTER — Encounter: Payer: Self-pay | Admitting: Physician Assistant

## 2015-11-22 ENCOUNTER — Ambulatory Visit (INDEPENDENT_AMBULATORY_CARE_PROVIDER_SITE_OTHER): Payer: BLUE CROSS/BLUE SHIELD | Admitting: Physician Assistant

## 2015-11-22 VITALS — BP 140/80 | HR 82 | Temp 98.2°F | Resp 16 | Wt 188.2 lb

## 2015-11-22 DIAGNOSIS — M25562 Pain in left knee: Secondary | ICD-10-CM

## 2015-11-22 DIAGNOSIS — M25469 Effusion, unspecified knee: Secondary | ICD-10-CM

## 2015-11-22 DIAGNOSIS — M5441 Lumbago with sciatica, right side: Secondary | ICD-10-CM | POA: Diagnosis not present

## 2015-11-22 DIAGNOSIS — M25551 Pain in right hip: Secondary | ICD-10-CM | POA: Diagnosis not present

## 2015-11-22 MED ORDER — OXYCODONE-ACETAMINOPHEN 5-325 MG PO TABS: 1.0000 | ORAL_TABLET | Freq: Three times a day (TID) | ORAL | Status: DC | PRN

## 2015-11-22 NOTE — Telephone Encounter (Signed)
Please advise.  Thanks,  -Joseline 

## 2015-11-22 NOTE — Telephone Encounter (Signed)
Advised wife as below. Patient would like to come in for knee pain for immediate relief and still be referred to ortho.

## 2015-11-22 NOTE — Telephone Encounter (Signed)
Pts wife called saying he has a lot of fluid in his knee.  She wants to know where he can be referred to so he can have it drawn off.  Her call back is  [phone]  Thanks, Barth Kirks

## 2015-11-22 NOTE — Progress Notes (Signed)
Patient: Phillip Santana Male    DOB: June 10, 1971   45 y.o.   MRN: [id number] Visit Date: 11/22/2015  Today's Provider: Margaretann Loveless, PA-C   Chief Complaint  Patient presents with  . Knee Pain    Patient has Fluid on his knee.   Subjective:    HPI Phillip Santana is a 45 yr old male that comes here today with c/o left knee pain with lots of fluid. He has had previous surgeries on the L knee including "putting a bone graft into the cartilage." He has also been to flexogenics clinics in the past. He has had fluid drained from his knee in the past multiple times. He has been previously seen by Dr. Thurston Hole for his left knee also.  He is also having low back pain with radiculopathy down the right buttocks, across the right hip and down the right leg to his toes. He states he knows he has a ruptured disc and feels it is starting to encroach on his nerves now. Denies loss of bowel or bladder function. He is an avid [employer] and states his knee started bothering him and became swollen following running in a softball game on Saturday.     No Known Allergies Previous Medications   L-ARGININE MAXIMUM STRENGTH PO    Take by mouth daily.   LYSINE 1000 MG TABS    Take 1 tablet by mouth daily.   MISC NATURAL PRODUCTS (ADRENAL PO)    Take 1 tablet by mouth daily. Fatigue fighter   TRAZODONE (DESYREL) 50 MG TABLET    Take 1 tablet (50 mg total) by mouth at bedtime as needed for sleep.    Review of Systems  Constitutional: Negative.   Respiratory: Negative.   Cardiovascular: Negative.   Genitourinary: Negative.   Musculoskeletal: Positive for back pain, joint swelling (left knee) and arthralgias (left knee, right hip). Negative for gait problem.  Neurological: Negative.     Social History  Substance Use Topics  . Smoking status: Never Smoker   . Smokeless tobacco: Not on file  . Alcohol Use: 0.0 oz/week    0 Standard drinks or equivalent per week     Comment: occasionally    Objective:   BP 140/80 mmHg  Pulse 82  Temp(Src) 98.2 F (36.8 C) (Oral)  Resp 16  Wt 188 lb 3.2 oz (85.367 kg)  Physical Exam  Constitutional: He appears well-developed and well-nourished. No distress.  HENT:  Head: Normocephalic and atraumatic.  Cardiovascular: Normal rate, regular rhythm and normal heart sounds.  Exam reveals no gallop and no friction rub.   No murmur heard. Pulmonary/Chest: Effort normal and breath sounds normal. No respiratory distress. He has no wheezes. He has no rales.  Musculoskeletal:       Right hip: Normal.       Left hip: Normal.       Right knee: Normal.       Left knee: He exhibits decreased range of motion, swelling and effusion (baker cyst noted left posterior). He exhibits no LCL laxity, normal patellar mobility, normal meniscus and no MCL laxity. Tenderness found. Medial joint line tenderness noted.       Lumbar back: He exhibits decreased range of motion and tenderness (right). He exhibits no spasm and normal pulse.  Skin: He is not diaphoretic.  Vitals reviewed.       Assessment & Plan:     1. Left knee pain Attempted to remove fluid from  left knee that was unsuccessful. Feel most fluid is located posteriorly in a Baker's cyst. Did not attempt posterior drainage. Continue ice and anti-inflammatories. Follow up with ortho on Weds (11/24/15). Call if no improvement or worsening symptoms. No running until otherwise noted.  2. Right hip pain I feel most pain from right hip is actually radiculopathy from lumbar spine due to symptoms and that it continues to foot on right side.   3. Right-sided low back pain with right-sided sciatica Will give pain medication as below for low back pain with radiculopathy until seen by orthopedics. Will follow up afterwards and see if he requires imaging and possible referral to neurosurgery for lumbago with right sided radiculopathy. - oxyCODONE-acetaminophen (PERCOCET/ROXICET) 5-325 MG tablet; Take 1 tablet by  mouth every 8 (eight) hours as needed for severe pain.  Dispense: 30 tablet; Refill: 0       Margaretann Loveless, PA-C  [facility]

## 2015-11-22 NOTE — Patient Instructions (Signed)
Baker Cyst °A Baker cyst is a sac-like structure that forms in the back of the knee. It is filled with the same fluid that is located in your knee. This fluid lubricates the bones and cartilage of the knee and allows them to move over each other more easily. °CAUSES  °When the knee becomes injured or inflamed, increased fluid forms in the knee. When this happens, the joint lining is pushed out behind the knee and forms the Baker cyst. This cyst may also be caused by inflammation from arthritic conditions and infections. °SIGNS AND SYMPTOMS  °A Baker cyst usually has no symptoms. When the cyst is substantially enlarged: °· You may feel pressure behind the knee, stiffness in the knee, or a mass in the area behind the knee. °· You may develop pain, redness, and swelling in the calf.  This can suggest a blood clot and requires evaluation by your health care provider. °DIAGNOSIS  °A Baker cyst is most often found during an ultrasound exam. This exam may have been performed for other reasons, and the cyst was found incidentally. Sometimes an MRI is used. This picks up other problems within a joint that an ultrasound exam may not. If the Baker cyst developed immediately after an injury, X-ray exams may be used to diagnose the cyst. °TREATMENT  °The treatment depends on the cause of the cyst. Anti-inflammatory medicines and rest often will be prescribed. If the cyst is caused by a bacterial infection, antibiotic medicines may be prescribed.  °HOME CARE INSTRUCTIONS  °· If the cyst was caused by an injury, for the first 24 hours, keep the injured leg elevated on 2 pillows while lying down. °· For the first 24 hours while you are awake, apply ice to the injured area: °¨ Put ice in a plastic bag. °¨ Place a towel between your skin and the bag. °¨ Leave the ice on for 20 minutes, 2-3 times a day. °· Only take over-the-counter or prescription medicines for pain, discomfort, or fever as directed by your health care  provider. °· Only take antibiotic medicine as directed. Make sure to finish it even if you start to feel better. °MAKE SURE YOU:  °· Understand these instructions. °· Will watch your condition. °· Will get help right away if you are not doing well or get worse. °  °This information is not intended to replace advice given to you by your health care provider. Make sure you discuss any questions you have with your health care provider. °  °Document Released: 09/18/2005 Document Revised: 07/09/2013 Document Reviewed: 04/30/2013 °Elsevier Interactive Patient Education ©2016 Elsevier Inc. ° °

## 2015-11-22 NOTE — Telephone Encounter (Signed)
I can refer him to orthopedics for further eval and treatment.

## 2015-11-24 ENCOUNTER — Telehealth: Payer: Self-pay | Admitting: Physician Assistant

## 2015-11-24 NOTE — Telephone Encounter (Signed)
Pt's wife Clydie Braun rescheduled pt's appointment with Dr Thurston Hole in the [facility] office 11/25/15

## 2015-11-24 NOTE — Telephone Encounter (Signed)
Pt's wife Clydie Braun would like to speak with Maralyn Sago today if possible. Clydie Braun stated that she thought his appt with ortho to get fluid drained off his knee was today not March 1. Clydie Braun would like to have [place] do a referral to someone that can see pt today b/c pt needs to work and he can not work with fluid on his knee. Thanks TNP

## 2015-12-06 ENCOUNTER — Ambulatory Visit (INDEPENDENT_AMBULATORY_CARE_PROVIDER_SITE_OTHER): Payer: BLUE CROSS/BLUE SHIELD | Admitting: Physician Assistant

## 2015-12-06 ENCOUNTER — Encounter: Payer: Self-pay | Admitting: Physician Assistant

## 2015-12-06 VITALS — BP 122/80 | HR 93 | Temp 98.2°F | Resp 16 | Wt 186.8 lb

## 2015-12-06 DIAGNOSIS — F329 Major depressive disorder, single episode, unspecified: Secondary | ICD-10-CM

## 2015-12-06 DIAGNOSIS — M1990 Unspecified osteoarthritis, unspecified site: Secondary | ICD-10-CM

## 2015-12-06 DIAGNOSIS — H6123 Impacted cerumen, bilateral: Secondary | ICD-10-CM

## 2015-12-06 DIAGNOSIS — F32A Depression, unspecified: Secondary | ICD-10-CM

## 2015-12-06 MED ORDER — ESCITALOPRAM OXALATE 10 MG PO TABS: 10.0000 mg | ORAL_TABLET | Freq: Every day | ORAL | Status: DC

## 2015-12-06 NOTE — Progress Notes (Signed)
Patient: Phillip Santana Male    DOB: [birth year]   45 y.o.   MRN: [id number] Visit Date: 12/06/2015  Today's Provider: Margaretann LovelessJennifer M Taleyah Hillman, PA-C   Chief Complaint  Patient presents with  . Follow-up    Depression   Subjective:    HPI  Patient is here to follow-up on Depression. Last office visit he was doing better. He was going to the gym and taking a new supplement to help with his energy. His mood, energy, and rest had improved. He had discontinued all antidepressants at that time as well. He states approx 3 weeks ago he wanted to go spend time with his son at the beach while his son tried out for a new softball team and his son told him he did not want him to go. Now he feels sad, he wants to stay at home doesn't want to go the gym. Doesn't want to be around anybody, decreased concentration, racing thoughts, for him everything is negative nothing positive. No suicidal ideas. Took 2 tablets of Trazadone two days ago to help go to sleep.  States he is sleeping for approximately 3 hours at a time now.  Also complains of having trouble hearing.     No Known Allergies Previous Medications   L-ARGININE MAXIMUM STRENGTH PO    Take by mouth daily.   LYSINE 1000 MG TABS    Take 1 tablet by mouth daily.   MISC NATURAL PRODUCTS (ADRENAL PO)    Take 1 tablet by mouth daily. Fatigue fighter   OXYCODONE-ACETAMINOPHEN (PERCOCET/ROXICET) 5-325 MG TABLET    Take 1 tablet by mouth every 8 (eight) hours as needed for severe pain.   TRAZODONE (DESYREL) 50 MG TABLET    Take 1 tablet (50 mg total) by mouth at bedtime as needed for sleep.    Review of Systems  Constitutional: Negative.   Respiratory: Negative.   Cardiovascular: Negative.   Gastrointestinal: Negative.   Neurological: Negative.   Psychiatric/Behavioral: Positive for sleep disturbance, dysphoric mood, decreased concentration and agitation. Negative for suicidal ideas.    Social History  Substance Use Topics  . Smoking status:  Never Smoker   . Smokeless tobacco: Not on file  . Alcohol Use: 0.0 oz/week    0 Standard drinks or equivalent per week     Comment: occasionally   Objective:   BP 122/80 mmHg  Pulse 93  Temp(Src) 98.2 F (36.8 C) (Oral)  Resp 16  Wt 186 lb 12.8 oz (84.732 kg)  Physical Exam  Constitutional: He appears well-developed and well-nourished. No distress.  HENT:  Head: Normocephalic and atraumatic.  Right Ear: Tympanic membrane, external ear and ear canal normal.  Left Ear: Tympanic membrane, external ear and ear canal normal.  Nose: Nose normal.  Mouth/Throat: Uvula is midline, oropharynx is clear and moist and mucous membranes are normal. No oropharyngeal exudate, posterior oropharyngeal edema or posterior oropharyngeal erythema.  Cerumen impaction bilaterally  Neck: Normal range of motion. Neck supple. No JVD present. No tracheal deviation present. No thyromegaly present.  Cardiovascular: Normal rate, regular rhythm and normal heart sounds.  Exam reveals no gallop and no friction rub.   No murmur heard. Pulmonary/Chest: Effort normal and breath sounds normal. No respiratory distress. He has no wheezes. He has no rales.  Lymphadenopathy:    He has no cervical adenopathy.  Skin: He is not diaphoretic.  Psychiatric: His speech is normal and behavior is normal. Judgment and thought content normal. Cognition and memory  are normal. He exhibits a depressed mood.  Vitals reviewed.       Assessment & Plan:    s of 1. Depression He was given 2 weeks of samples of pristiq for him to try at home initially.  If he does well with this he is to call and we will switch the lexapro as below to pristiq instead.  If he does not do well he will fill lexapro Rx as below to see if this offers better response. I offered counseling but he states he has done this in the past and had a bad experience and does not wish to do that at this time. I will see him back in 4-6 weeks to re-evalaute.  -  escitalopram (LEXAPRO) 10 MG tablet; Take 1 tablet (10 mg total) by mouth daily. Take 1/2 tab PO at bedtime x 1 week then increase to 1 tab PO at bedtime thereafter  Dispense: 30 tablet; Refill: 1  2. Arthritis Stable. Diagnosis pulled to update medication he was put on at orthopedics visit.  Sees Dr. Thurston Hole. - meloxicam (MOBIC) 15 MG tablet; Take 1 tablet (15 mg total) by mouth daily.  Dispense: 30 tablet; Refill: 0  3. Cerumen impaction, bilateral Ears cleaned bilaterally successfully.  Well tolerated. Advised to start debrox drops. - Ear Lavage       Margaretann Loveless, PA-C  [facility]

## 2015-12-07 NOTE — Patient Instructions (Signed)
Desvenlafaxine extended-release tablets What is this medicine? DESVENLAFAXINE (des VEN la FAX een) is used to treat depression. This medicine may be used for other purposes; ask your health care provider or pharmacist if you have questions. What should I tell my health care provider before I take this medicine? They need to know if you have any of these conditions: -glaucoma -high blood pressure -kidney disease -liver disease -mania or bipolar disorder -suicidal thoughts or a previous suicide attempt -an unusual reaction to desvenlafaxine, venlafaxine, other medicines, foods, dyes, or preservatives -pregnant or trying to get pregnant -breast-feeding How should I use this medicine? Take this medicine by mouth with a drink of water. Do not crush, cut or chew. Follow the directions on the prescription label. Take your doses at regular intervals. Do not take your medicine more often than directed. Do not stop taking this medicine suddenly except upon the advice of your doctor. Stopping this medicine too quickly may cause serious side effects or your condition may worsen. Contact your pediatrician or health care professional regarding the use of this medicine in children. Special care may be needed. A special MedGuide will be given to you by the pharmacist with each prescription and refill. Be sure to read this information carefully each time. Overdosage: If you think you have taken too much of this medicine contact a poison control center or emergency room at once. NOTE: This medicine is only for you. Do not share this medicine with others. What if I miss a dose? If you miss a dose, take it as soon as you can. If it is almost time for your next dose, take only that dose. Do not take double or extra doses. What may interact with this medicine? Do not take this medicine with any of the following medications: -duloxetine -linezolid -MAOIs like Azilect, Carbex, Eldepryl, Marplan, Nardil, and  Parnate -methylene blue (injected into a vein) -venlafaxine This medicine may also interact with the following medications: -alcohol -amphetamine -aspirin and aspirin-like medicines -certain migraine headache medicines (almotriptan, eletriptan, frovatriptan, naratriptan, rizatriptan, sumatriptan, zolmitriptan) -dexfenfluramine or fenfluramine -dextroamphetamine -furazolidone -isoniazid -lithium -medicines for heart rhythm or blood pressure -medicines that treat or prevent blood clots like warfarin, enoxaparin, and dalteparin -methylphenidate -metoclopramide -NSAIDS, medicines for pain and inflammation, like ibuprofen or naproxen -pentazocine -phentermine -procarbazine -protriptyline -sibutramine -St. John's wort, Hypericum perforatum -tramadol -tryptophan -zolpidem This list may not describe all possible interactions. Give your health care provider a list of all the medicines, herbs, non-prescription drugs, or dietary supplements you use. Also tell them if you smoke, drink alcohol, or use illegal drugs. Some items may interact with your medicine. What should I watch for while using this medicine? Tell your doctor if your symptoms do not get better or if they get worse. Visit your doctor or health care professional for regular checks on your progress. Because it may take several weeks to see the full effects of this medicine, it is important to continue your treatment as prescribed by your doctor. Patients and their families should watch out for new or worsening thoughts of suicide or depression. Also watch out for sudden changes in feelings such as feeling anxious, agitated, panicky, irritable, hostile, aggressive, impulsive, severely restless, overly excited and hyperactive, or not being able to sleep. If this happens, especially at the beginning of treatment or after a change in dose, call your health care professional. This medicine can cause an increase in blood pressure. Check  with your doctor for instructions on monitoring your blood pressure   while taking this medicine. You may get drowsy or dizzy. Do not drive, use machinery, or do anything that needs mental alertness until you know how this medicine affects you. Do not stand or sit up quickly, especially if you are an older patient. This reduces the risk of dizzy or fainting spells. Alcohol may interfere with the effect of this medicine. Avoid alcoholic drinks. Your mouth may get dry. Chewing sugarless gum, sucking hard candy and drinking plenty of water will help. Contact your doctor if the problem does not go away or is severe. What side effects may I notice from receiving this medicine? Side effects that you should report to your doctor or health care professional as soon as possible: -allergic reactions like skin rash, itching or hives, swelling of the face, lips, or tongue -hallucination, loss of contact with reality -mania (over-active behavior) -increase in blood pressure -redness, blistering, peeling or loosening of the skin, including inside the mouth -seizures -sexual difficulties (abnormal ejaculation or orgasm) -unusual bleeding or bruising -vomiting Side effects that usually do not require medical attention (report to your doctor or health care professional if they continue or are bothersome): -constipation -difficulty sleeping -dizziness, drowsiness -dry mouth -increased sweating -loss of appetite -nausea -tremor This list may not describe all possible side effects. Call your doctor for medical advice about side effects. You may report side effects to FDA at 1-800-FDA-1088. Where should I keep my medicine? Keep out of the reach of children. Store at room temperature between 15 and 30 degrees C (59 and 86 degrees F). Throw away any unused medicine after the expiration date. NOTE: This sheet is a summary. It may not cover all possible information. If you have questions about this medicine, talk to  your doctor, pharmacist, or health care provider.    2016, Elsevier/Gold Standard. (2014-01-19 10:39:35)  

## 2015-12-08 ENCOUNTER — Telehealth: Payer: Self-pay | Admitting: Physician Assistant

## 2015-12-08 NOTE — Telephone Encounter (Signed)
Pt called about refill for daughter.  Note in daughters chart

## 2015-12-20 ENCOUNTER — Telehealth: Payer: Self-pay | Admitting: Physician Assistant

## 2015-12-20 ENCOUNTER — Other Ambulatory Visit: Payer: Self-pay | Admitting: Physician Assistant

## 2015-12-20 DIAGNOSIS — F329 Major depressive disorder, single episode, unspecified: Secondary | ICD-10-CM

## 2015-12-20 DIAGNOSIS — F32A Depression, unspecified: Secondary | ICD-10-CM

## 2015-12-20 DIAGNOSIS — F3341 Major depressive disorder, recurrent, in partial remission: Secondary | ICD-10-CM

## 2015-12-20 MED ORDER — ESCITALOPRAM OXALATE 10 MG PO TABS: 10.0000 mg | ORAL_TABLET | Freq: Every day | ORAL | Status: DC

## 2015-12-20 MED ORDER — DESVENLAFAXINE SUCCINATE ER 100 MG PO TB24: 100.0000 mg | ORAL_TABLET | Freq: Every day | ORAL | Status: DC

## 2015-12-20 NOTE — Telephone Encounter (Signed)
Spoke with Clydie BraunKaren and she wants to know if Onalee HuaDavid can get a higher dose of the pristiq if not can you give a higher dose of the Lexapro. Explained to Clydie BraunKaren what it said on the last office visit note and She said to ask you if it is possible to give a higher dose on the pristiq because patient feels good on it but needs a higher dose.

## 2015-12-20 NOTE — Telephone Encounter (Signed)
Clydie BraunKaren patient's wife advised as directed below.  Thanks,  -Geoffrey Mankin

## 2015-12-20 NOTE — Telephone Encounter (Signed)
Pt 's wife Clydie BraunKaren calling wanting a return call from the nurse, Clydie BraunKaren has some questions about her husband medication that he just started recently.  CB# [phone] Thanks CC

## 2015-12-20 NOTE — Telephone Encounter (Signed)
[place]mg  [place] [facility]  Cancel lexapro Rx.

## 2015-12-20 NOTE — Telephone Encounter (Signed)
Per Clydie BraunKaren the Pristiq is $270 and she can't afford that if you can please write the Lexapro with the higher dose.  Thanks,  -Joseline

## 2015-12-20 NOTE — Telephone Encounter (Signed)
If he has not tried the lexapro he needs to start at the 10mg  and we can titrate up after the 1st 4 weeks.

## 2016-01-03 ENCOUNTER — Encounter: Payer: Self-pay | Admitting: Physician Assistant

## 2016-01-03 ENCOUNTER — Ambulatory Visit (INDEPENDENT_AMBULATORY_CARE_PROVIDER_SITE_OTHER): Payer: BLUE CROSS/BLUE SHIELD | Admitting: Physician Assistant

## 2016-01-03 VITALS — BP 114/88 | HR 69 | Temp 98.4°F | Resp 14 | Wt 185.2 lb

## 2016-01-03 DIAGNOSIS — G4700 Insomnia, unspecified: Secondary | ICD-10-CM

## 2016-01-03 DIAGNOSIS — F329 Major depressive disorder, single episode, unspecified: Secondary | ICD-10-CM | POA: Diagnosis not present

## 2016-01-03 DIAGNOSIS — G4726 Circadian rhythm sleep disorder, shift work type: Secondary | ICD-10-CM

## 2016-01-03 DIAGNOSIS — F32A Depression, unspecified: Secondary | ICD-10-CM

## 2016-01-03 MED ORDER — TRAZODONE HCL 50 MG PO TABS: 50.0000 mg | ORAL_TABLET | Freq: Every evening | ORAL | Status: DC | PRN

## 2016-01-03 MED ORDER — VENLAFAXINE HCL 75 MG PO TABS: 75.0000 mg | ORAL_TABLET | Freq: Two times a day (BID) | ORAL | Status: DC

## 2016-01-03 NOTE — Patient Instructions (Signed)
Venlafaxine tablets What is this medicine? VENLAFAXINE (VEN la fax een) is used to treat depression, anxiety and panic disorder. This medicine may be used for other purposes; ask your health care provider or pharmacist if you have questions. What should I tell my health care provider before I take this medicine? They need to know if you have any of these conditions: -bleeding disorders -glaucoma -heart disease -high blood pressure -high cholesterol -kidney disease -liver disease -low levels of sodium in the blood -mania or bipolar disorder -seizures -suicidal thoughts, plans, or attempt; a previous suicide attempt by you or a family -take medicines that treat or prevent blood clots -thyroid disease -an unusual or allergic reaction to venlafaxine, desvenlafaxine, other medicines, foods, dyes, or preservatives -pregnant or trying to get pregnant -breast-feeding How should I use this medicine? Take this medicine by mouth with a glass of water. Follow the directions on the prescription label. Take it with food. Take your medicine at regular intervals. Do not take your medicine more often than directed. Do not stop taking this medicine suddenly except upon the advice of your doctor. Stopping this medicine too quickly may cause serious side effects or your condition may worsen. A special MedGuide will be given to you by the pharmacist with each prescription and refill. Be sure to read this information carefully each time. Talk to your pediatrician regarding the use of this medicine in children. Special care may be needed. Overdosage: If you think you have taken too much of this medicine contact a poison control center or emergency room at once. NOTE: This medicine is only for you. Do not share this medicine with others. What if I miss a dose? If you miss a dose, take it as soon as you can. If it is almost time for your next dose, take only that dose. Do not take double or extra doses. What  may interact with this medicine? Do not take this medicine with any of the following medications: -certain medicines for fungal infections like fluconazole, itraconazole, ketoconazole, posaconazole, voriconazole -cisapride -desvenlafaxine -dofetilide -dronedarone -duloxetine -levomilnacipran -linezolid -MAOIs like Carbex, Eldepryl, Marplan, Nardil, and Parnate -methylene blue (injected into a vein) -milnacipran -pimozide -thioridazine -ziprasidone This medicine may also interact with the following medications: -aspirin and aspirin-like medicines -certain medicines for depression, anxiety, or psychotic disturbances -certain medicines for migraine headaches like almotriptan, eletriptan, frovatriptan, naratriptan, rizatriptan, sumatriptan, zolmitriptan -certain medicines for sleep -certain medicines that treat or prevent blood clots like dalteparin, enoxaparin, warfarin -cimetidine -clozapine -diuretics -fentanyl -furazolidone -indinavir -isoniazid -lithium -metoprolol -NSAIDS, medicines for pain and inflammation, like ibuprofen or naproxen -other medicines that prolong the QT interval (cause an abnormal heart rhythm) -procarbazine -rasagiline -supplements like St. John's wort, kava kava, valerian -tramadol -tryptophan This list may not describe all possible interactions. Give your health care provider a list of all the medicines, herbs, non-prescription drugs, or dietary supplements you use. Also tell them if you smoke, drink alcohol, or use illegal drugs. Some items may interact with your medicine. What should I watch for while using this medicine? Tell your doctor if your symptoms do not get better or if they get worse. Visit your doctor or health care professional for regular checks on your progress. Because it may take several weeks to see the full effects of this medicine, it is important to continue your treatment as prescribed by your doctor. Patients and their families  should watch out for new or worsening thoughts of suicide or depression. Also watch out for sudden changes  in feelings such as feeling anxious, agitated, panicky, irritable, hostile, aggressive, impulsive, severely restless, overly excited and hyperactive, or not being able to sleep. If this happens, especially at the beginning of treatment or after a change in dose, call your health care professional. This medicine can cause an increase in blood pressure. Check with your doctor for instructions on monitoring your blood pressure while taking this medicine. You may get drowsy or dizzy. Do not drive, use machinery, or do anything that needs mental alertness until you know how this medicine affects you. Do not stand or sit up quickly, especially if you are an older patient. This reduces the risk of dizzy or fainting spells. Alcohol may interfere with the effect of this medicine. Avoid alcoholic drinks. Your mouth may get dry. Chewing sugarless gum, sucking hard candy and drinking plenty of water will help. Contact your doctor if the problem does not go away or is severe. What side effects may I notice from receiving this medicine? Side effects that you should report to your doctor or health care professional as soon as possible: -allergic reactions like skin rash, itching or hives, swelling of the face, lips, or tongue -breathing problems -changes in vision -seizures -suicidal thoughts or other mood changes -trouble passing urine or change in the amount of urine -unusual bleeding or bruising Side effects that usually do not require medical attention (report to your doctor or health care professional if they continue or are bothersome): -change in sex drive or performance -constipation -increased sweating -loss of appetite -nausea -tremors -weight loss This list may not describe all possible side effects. Call your doctor for medical advice about side effects. You may report side effects to FDA at  1-800-FDA-1088. Where should I keep my medicine? Keep out of the reach of children. Store at a controlled temperature between 20 and 25 degrees C (68 and 77 degrees F), in a dry place. Throw away any unused medicine after the expiration date. NOTE: This sheet is a summary. It may not cover all possible information. If you have questions about this medicine, talk to your doctor, pharmacist, or health care provider.    2016, Elsevier/Gold Standard. (2013-04-15 12:43:55)

## 2016-01-03 NOTE — Progress Notes (Signed)
Patient ID: Phillip Santana, male   DOB: [birth year], 45 y.o.   MRN: [id number]   Patient: Phillip Santana Male    DOB: August 14, 1971   45 y.o.   MRN: [id number] Visit Date: 01/03/2016  Today's Provider: Margaretann Loveless, PA-C   Chief Complaint  Patient presents with  . Depression  . Follow-up   Subjective:    Depression      The patient presents with depression.  This is a chronic problem.  The current episode started more than 1 year ago.   The problem occurs constantly.The problem is unchanged.( Lack of motivation, increased sleeping )  Treatments tried: started Lexapro 10 mg on 12/06/2015.  Compliance with treatment is good.  Previous treatment provided mild relief.  Past medical history includes anxiety and depression.    He has been on Wellbutrin with some relief but eventually quit working as well. He had no response with zoloft, celexa, paxil, prozac or lexapro. He did have a good response with pristiq however had to discontinue due to cost.   Previous Medications   ESCITALOPRAM (LEXAPRO) 10 MG TABLET    Take 1 tablet (10 mg total) by mouth daily.   L-ARGININE MAXIMUM STRENGTH PO    Take by mouth daily.   LYSINE 1000 MG TABS    Take 1 tablet by mouth daily.   MELOXICAM (MOBIC) 15 MG TABLET    Take 1 tablet (15 mg total) by mouth daily.   MISC NATURAL PRODUCTS (ADRENAL PO)    Take 1 tablet by mouth daily. Fatigue fighter   OXYCODONE-ACETAMINOPHEN (PERCOCET/ROXICET) 5-325 MG TABLET    Take 1 tablet by mouth every 8 (eight) hours as needed for severe pain.   TRAZODONE (DESYREL) 50 MG TABLET    Take 1 tablet (50 mg total) by mouth at bedtime as needed for sleep.   No Known Allergies  Review of Systems  Constitutional: Negative.   HENT: Negative.   Eyes: Negative.   Respiratory: Negative.   Cardiovascular: Negative.   Gastrointestinal: Negative.   Endocrine: Negative.   Genitourinary: Negative.   Musculoskeletal: Negative.   Skin: Negative.   Allergic/Immunologic: Negative.     Neurological: Negative.   Hematological: Negative.   Psychiatric/Behavioral: Positive for depression. The patient is nervous/anxious.     Social History  Substance Use Topics  . Smoking status: Never Smoker   . Smokeless tobacco: Not on file  . Alcohol Use: 0.0 oz/week    0 Standard drinks or equivalent per week     Comment: occasionally   Objective:   BP 114/88 mmHg  Pulse 69  Temp(Src) 98.4 F (36.9 C) (Oral)  Resp 14  Wt 185 lb 3.2 oz (84.006 kg)  Physical Exam  Constitutional: He appears well-developed and well-nourished. No distress.  HENT:  Head: Normocephalic and atraumatic.  Cardiovascular: Normal rate, regular rhythm and normal heart sounds.  Exam reveals no gallop and no friction rub.   No murmur heard. Pulmonary/Chest: Effort normal and breath sounds normal. No respiratory distress. He has no wheezes. He has no rales.  Skin: He is not diaphoretic.  Psychiatric: He has a normal mood and affect. His behavior is normal. Judgment and thought content normal.  Vitals reviewed.       Assessment & Plan:     1. Depression Not improved but not worsened. Still has no suicidal ideations. Feels as if he is a failure. Will switch lexapro to venlafaxine as below. He feels he has tried Care[employer] information [employer] many years ago but  cannot remember why he had stopped taking it. He is to call if he has any adverse reactions to the medication. If not I will see him back in 4 weeks to see how he is doing. - venlafaxine (EFFEXOR) 75 MG tablet; Take 1 tablet (75 mg total) by mouth 2 (two) times daily with a meal.  Dispense: 60 tablet; Refill: 0  2. Insomnia Stable. Diagnosis pulled for medication refill. Continue current medical treatment plan. - traZODone (DESYREL) 50 MG tablet; Take 1 tablet (50 mg total) by mouth at bedtime as needed for sleep.  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   Follow up: No Follow-up on file.

## 2016-01-31 ENCOUNTER — Ambulatory Visit (INDEPENDENT_AMBULATORY_CARE_PROVIDER_SITE_OTHER): Payer: BLUE CROSS/BLUE SHIELD | Admitting: Physician Assistant

## 2016-01-31 ENCOUNTER — Telehealth: Payer: Self-pay | Admitting: Physician Assistant

## 2016-01-31 ENCOUNTER — Encounter: Payer: Self-pay | Admitting: Physician Assistant

## 2016-01-31 VITALS — BP 120/80 | HR 78 | Temp 97.9°F | Resp 16 | Wt 181.4 lb

## 2016-01-31 DIAGNOSIS — F32A Depression, unspecified: Secondary | ICD-10-CM

## 2016-01-31 DIAGNOSIS — L259 Unspecified contact dermatitis, unspecified cause: Secondary | ICD-10-CM

## 2016-01-31 DIAGNOSIS — F329 Major depressive disorder, single episode, unspecified: Secondary | ICD-10-CM | POA: Diagnosis not present

## 2016-01-31 DIAGNOSIS — E782 Mixed hyperlipidemia: Secondary | ICD-10-CM

## 2016-01-31 MED ORDER — VENLAFAXINE HCL 75 MG PO TABS: 75.0000 mg | ORAL_TABLET | Freq: Two times a day (BID) | ORAL | Status: DC

## 2016-01-31 MED ORDER — TRIAMCINOLONE ACETONIDE 0.1 % EX CREA: 1.0000 "application " | TOPICAL_CREAM | Freq: Two times a day (BID) | CUTANEOUS | Status: DC

## 2016-01-31 MED ORDER — DESVENLAFAXINE SUCCINATE ER 100 MG PO TB24: 100.0000 mg | ORAL_TABLET | Freq: Every day | ORAL | Status: DC

## 2016-01-31 NOTE — Telephone Encounter (Signed)
Spoke with Thayer Ohmhris at [facility]. Will switch back to Effexor 75mg  BID.

## 2016-01-31 NOTE — Telephone Encounter (Signed)
Chris with [facility] stated [employer] insurance will not cover desvenlafaxine (PRISTIQ) 100 MG 24 hr tablet b/c pt has a high deductible. Thayer OhmChris stated that even with the coupon that pt brought in, the medication will cost pt 230$ out of pocket. Thayer OhmChris suggest that pt go back on Effexor and adjust his dosage. Please advise. Thanks TNP

## 2016-01-31 NOTE — Progress Notes (Signed)
Patient: Phillip Santana Male    DOB: 27-Aug-1971   45 y.o.   MRN: [id number] Visit Date: 01/31/2016  Today's Provider: Margaretann Loveless, PA-C   Chief Complaint  Patient presents with  . Depression    4 week follow-up.   Subjective:    HPI  Phillip Santana is here for his 4 week follow-up of depression. Last OV he was prescribed Effexor 75 MG immediate release. He was to titrate to 1 pill BID, but is only taking 1 pill currently. He reports that overall he feels good. Only side effect from the medicine it gives him stomach discomfort and makes him feel nauseous. Is worse when he doesn't eat.   He is continuing to sleep more than he used to and feels this is still a sign of his depression not being fully controlled even though his mood has changed. States he remembers his dad sleeping all the time like that as well and felt his dad was depressed even though he was not treated. He has had multiple sleep studies that have been normal in the past.   Improvements in mood have been noted and include that he is not feeling like he is a failure or feeling like he was before at work with no motivation.  At work he is doing good and is getting more offers for different jobs positions and promotions.   Past medical history: Anxiety and Depression.He had no response with zoloft, celexa, paxil, prozac, or lexapro. Had a decent response on samples of Pristiq, but refills have been too expensive to continue.      No Known Allergies Previous Medications   L-ARGININE MAXIMUM STRENGTH PO    Take by mouth daily.   LYSINE 1000 MG TABS    Take 1 tablet by mouth daily.   MELOXICAM (MOBIC) 15 MG TABLET    Take 1 tablet (15 mg total) by mouth daily.   MISC NATURAL PRODUCTS (ADRENAL PO)    Take 1 tablet by mouth daily. Fatigue fighter   OXYCODONE-ACETAMINOPHEN (PERCOCET/ROXICET) 5-325 MG TABLET    Take 1 tablet by mouth every 8 (eight) hours as needed for severe pain.   TRAZODONE (DESYREL) 50 MG TABLET     Take 1 tablet (50 mg total) by mouth at bedtime as needed for sleep.   VENLAFAXINE (EFFEXOR) 75 MG TABLET    Take 1 tablet (75 mg total) by mouth 2 (two) times daily with a meal.    Review of Systems  Constitutional: Positive for activity change (still sleeping more than normal) and fatigue (not at work only at home).  Respiratory: Negative.   Cardiovascular: Negative.   Gastrointestinal: Positive for nausea (right after taking effexor). Negative for vomiting, abdominal pain, diarrhea and constipation.  Psychiatric/Behavioral: Positive for sleep disturbance (sleeping too much). Negative for suicidal ideas, dysphoric mood (feels motivated), decreased concentration and agitation. The patient is not nervous/anxious.     Social History  Substance Use Topics  . Smoking status: Never Smoker   . Smokeless tobacco: Not on file  . Alcohol Use: 0.0 oz/week    0 Standard drinks or equivalent per week     Comment: occasionally   Objective:   BP 120/80 mmHg  Pulse 78  Temp(Src) 97.9 F (36.6 C) (Oral)  Resp 16  Wt 181 lb 6.4 oz (82.283 kg)  Physical Exam  Constitutional: He appears well-developed and well-nourished. No distress.  HENT:  Head: Normocephalic and atraumatic.  Cardiovascular: Normal rate, regular  rhythm and normal heart sounds.  Exam reveals no gallop and no friction rub.   No murmur heard. Pulmonary/Chest: Effort normal and breath sounds normal. No respiratory distress. He has no wheezes. He has no rales.  Skin: Rash noted. Rash is vesicular. He is not diaphoretic.     Psychiatric: He has a normal mood and affect. His behavior is normal. Judgment and thought content normal.  Vitals reviewed.       Assessment & Plan:     1. Clinical depression Not fully controlled. Will switch back to Pristiq and obtain pharmacy payment card to see if this helps lessen copay since he had done well on this medication. Effexor is working but he is having side effect of nausea and  "butterflies" in his stomach. Still feels he is sleeping too much and has no energy when he gets home from work.   Addend: Phillip Santana from [facility] called and stated even with pharmacy card it was going to be $230 copay for Pristiq. Switched back to Effexor 75mg  1 tab PO with food BID. I will see Phillip Santana back in 4 weeks to see how he is tolerating the increase.   2. Contact dermatitis Does not wish to have oral steroid just yet. He has only used benadryl cream and tabs without complete relief. Will add triamcinolone cream as below for symptomatic relief. He is to call if no improvement and will consider oral steroid taper.  - triamcinolone cream (KENALOG) 0.1 %; Apply 1 application topically 2 (two) times daily.  Dispense: 30 g; Refill: 0  3. Hypercholesterolemia with hypertriglyceridemia Had biometric screening through work and was found to have elevated total cholesterol, high triglycerides, high LDL and low HDL. He is going to try lifestyle modification for 6 months. Will recheck cholesterol in 6 months. If still elevated will add statin.       Phillip LovelessJennifer M Tymeka Privette, PA-C  [facility]

## 2016-01-31 NOTE — Patient Instructions (Signed)
Desvenlafaxine extended-release tablets What is this medicine? DESVENLAFAXINE (des VEN la FAX een) is used to treat depression. This medicine may be used for other purposes; ask your health care provider or pharmacist if you have questions. What should I tell my health care provider before I take this medicine? They need to know if you have any of these conditions: -glaucoma -high blood pressure -kidney disease -liver disease -mania or bipolar disorder -suicidal thoughts or a previous suicide attempt -an unusual reaction to desvenlafaxine, venlafaxine, other medicines, foods, dyes, or preservatives -pregnant or trying to get pregnant -breast-feeding How should I use this medicine? Take this medicine by mouth with a drink of water. Do not crush, cut or chew. Follow the directions on the prescription label. Take your doses at regular intervals. Do not take your medicine more often than directed. Do not stop taking this medicine suddenly except upon the advice of your doctor. Stopping this medicine too quickly may cause serious side effects or your condition may worsen. Contact your pediatrician or health care professional regarding the use of this medicine in children. Special care may be needed. A special MedGuide will be given to you by the pharmacist with each prescription and refill. Be sure to read this information carefully each time. Overdosage: If you think you have taken too much of this medicine contact a poison control center or emergency room at once. NOTE: This medicine is only for you. Do not share this medicine with others. What if I miss a dose? If you miss a dose, take it as soon as you can. If it is almost time for your next dose, take only that dose. Do not take double or extra doses. What may interact with this medicine? Do not take this medicine with any of the following medications: -duloxetine -linezolid -MAOIs like Azilect, Carbex, Eldepryl, Marplan, Nardil, and  Parnate -methylene blue (injected into a vein) -venlafaxine This medicine may also interact with the following medications: -alcohol -amphetamine -aspirin and aspirin-like medicines -certain migraine headache medicines (almotriptan, eletriptan, frovatriptan, naratriptan, rizatriptan, sumatriptan, zolmitriptan) -dexfenfluramine or fenfluramine -dextroamphetamine -furazolidone -isoniazid -lithium -medicines for heart rhythm or blood pressure -medicines that treat or prevent blood clots like warfarin, enoxaparin, and dalteparin -methylphenidate -metoclopramide -NSAIDS, medicines for pain and inflammation, like ibuprofen or naproxen -pentazocine -phentermine -procarbazine -protriptyline -sibutramine -St. John's wort, Hypericum perforatum -tramadol -tryptophan -zolpidem This list may not describe all possible interactions. Give your health care provider a list of all the medicines, herbs, non-prescription drugs, or dietary supplements you use. Also tell them if you smoke, drink alcohol, or use illegal drugs. Some items may interact with your medicine. What should I watch for while using this medicine? Tell your doctor if your symptoms do not get better or if they get worse. Visit your doctor or health care professional for regular checks on your progress. Because it may take several weeks to see the full effects of this medicine, it is important to continue your treatment as prescribed by your doctor. Patients and their families should watch out for new or worsening thoughts of suicide or depression. Also watch out for sudden changes in feelings such as feeling anxious, agitated, panicky, irritable, hostile, aggressive, impulsive, severely restless, overly excited and hyperactive, or not being able to sleep. If this happens, especially at the beginning of treatment or after a change in dose, call your health care professional. This medicine can cause an increase in blood pressure. Check  with your doctor for instructions on monitoring your blood pressure   while taking this medicine. You may get drowsy or dizzy. Do not drive, use machinery, or do anything that needs mental alertness until you know how this medicine affects you. Do not stand or sit up quickly, especially if you are an older patient. This reduces the risk of dizzy or fainting spells. Alcohol may interfere with the effect of this medicine. Avoid alcoholic drinks. Your mouth may get dry. Chewing sugarless gum, sucking hard candy and drinking plenty of water will help. Contact your doctor if the problem does not go away or is severe. What side effects may I notice from receiving this medicine? Side effects that you should report to your doctor or health care professional as soon as possible: -allergic reactions like skin rash, itching or hives, swelling of the face, lips, or tongue -hallucination, loss of contact with reality -mania (over-active behavior) -increase in blood pressure -redness, blistering, peeling or loosening of the skin, including inside the mouth -seizures -sexual difficulties (abnormal ejaculation or orgasm) -unusual bleeding or bruising -vomiting Side effects that usually do not require medical attention (report to your doctor or health care professional if they continue or are bothersome): -constipation -difficulty sleeping -dizziness, drowsiness -dry mouth -increased sweating -loss of appetite -nausea -tremor This list may not describe all possible side effects. Call your doctor for medical advice about side effects. You may report side effects to FDA at 1-800-FDA-1088. Where should I keep my medicine? Keep out of the reach of children. Store at room temperature between 15 and 30 degrees C (59 and 86 degrees F). Throw away any unused medicine after the expiration date. NOTE: This sheet is a summary. It may not cover all possible information. If you have questions about this medicine, talk to  your doctor, pharmacist, or health care provider.    2016, Elsevier/Gold Standard. (2014-01-19 10:39:35)  

## 2016-02-01 DIAGNOSIS — E782 Mixed hyperlipidemia: Secondary | ICD-10-CM

## 2016-02-01 HISTORY — DX: Mixed hyperlipidemia: E78.2

## 2016-03-01 ENCOUNTER — Ambulatory Visit: Payer: Self-pay | Admitting: Physician Assistant

## 2016-03-14 DIAGNOSIS — M25562 Pain in left knee: Secondary | ICD-10-CM | POA: Diagnosis not present

## 2016-03-14 DIAGNOSIS — M5441 Lumbago with sciatica, right side: Secondary | ICD-10-CM | POA: Diagnosis not present

## 2016-03-14 DIAGNOSIS — M1712 Unilateral primary osteoarthritis, left knee: Secondary | ICD-10-CM | POA: Diagnosis not present

## 2016-03-23 DIAGNOSIS — M1712 Unilateral primary osteoarthritis, left knee: Secondary | ICD-10-CM | POA: Diagnosis not present

## 2016-03-28 DIAGNOSIS — M25562 Pain in left knee: Secondary | ICD-10-CM | POA: Diagnosis not present

## 2016-03-28 DIAGNOSIS — M1712 Unilateral primary osteoarthritis, left knee: Secondary | ICD-10-CM | POA: Diagnosis not present

## 2016-03-28 DIAGNOSIS — M5441 Lumbago with sciatica, right side: Secondary | ICD-10-CM | POA: Diagnosis not present

## 2016-05-08 ENCOUNTER — Encounter: Payer: Self-pay | Admitting: Physician Assistant

## 2016-05-08 ENCOUNTER — Ambulatory Visit (INDEPENDENT_AMBULATORY_CARE_PROVIDER_SITE_OTHER): Payer: BLUE CROSS/BLUE SHIELD | Admitting: Physician Assistant

## 2016-05-08 VITALS — BP 122/80 | HR 86 | Temp 98.1°F | Resp 16 | Wt 190.4 lb

## 2016-05-08 DIAGNOSIS — M5126 Other intervertebral disc displacement, lumbar region: Secondary | ICD-10-CM | POA: Diagnosis not present

## 2016-05-08 DIAGNOSIS — F329 Major depressive disorder, single episode, unspecified: Secondary | ICD-10-CM | POA: Diagnosis not present

## 2016-05-08 DIAGNOSIS — M1712 Unilateral primary osteoarthritis, left knee: Secondary | ICD-10-CM | POA: Insufficient documentation

## 2016-05-08 DIAGNOSIS — F32A Depression, unspecified: Secondary | ICD-10-CM

## 2016-05-08 MED ORDER — MELOXICAM 15 MG PO TABS: 15.0000 mg | ORAL_TABLET | Freq: Every day | ORAL | 5 refills | Status: DC

## 2016-05-08 MED ORDER — BUPROPION HCL ER (SR) 150 MG PO TB12: 150.0000 mg | ORAL_TABLET | Freq: Two times a day (BID) | ORAL | 3 refills | Status: DC

## 2016-05-08 NOTE — Progress Notes (Signed)
Patient: Phillip Santana Male    DOB: December 09, 1970   45 y.o.   MRN: [id number] Visit Date: 05/08/2016  Today's Provider: Margaretann LovelessJennifer M Kalysta Kneisley, PA-C   Chief Complaint  Patient presents with  . Depression   Subjective:    Depression         Associated symptoms include decreased concentration and fatigue.  Associated symptoms include no headaches and no suicidal ideas.  Depression: Patient complains of depression. He complains of depressed mood, difficulty concentrating, fatigue, feelings of worthlessness/guilt, hopelessness, insomnia and angry and a lot of agitation. He denies current suicidal and homicidal plan or intent. He states "I don't want to live". He reports that he didn't pick up the Effexor refill. So he is not taking anything and has been trying on his own. Worsening symptoms. Financial stress and family issues at home have caused his symptoms to worsen.   Patient also reports that he went to see Dr[facility] Darrelyn HillockGioffre, [facility], for his left knee. Reports he got an MRI and xray. He is going to start the hyalauronic acid injections and if he fails those, will consider TKR.  He also was seen for his low back pain that radiates into right buttocks and leg. They did an xray and MRI on his back and it showed herniated disc. He could not remember exact level but states it was low, maybe L5-S1. Patient was prescribed Meloxicam. He states that helped pretty good and would like refills on this.    No Known Allergies Current Meds  Medication Sig  . Lysine 1000 MG TABS Take 1 tablet by mouth daily.  Marland Kitchen. triamcinolone cream (KENALOG) 0.1 % Apply 1 application topically 2 (two) times daily.    Review of Systems  Constitutional: Positive for fatigue.  Respiratory: Negative for cough, chest tightness and shortness of breath.   Cardiovascular: Negative for chest pain, palpitations and leg swelling.  Gastrointestinal: Negative for abdominal pain.  Musculoskeletal: Positive for arthralgias  (left knee), back pain and joint swelling (left knee).  Neurological: Negative for dizziness, weakness, light-headedness, numbness and headaches.  Psychiatric/Behavioral: Positive for agitation, decreased concentration, depression, dysphoric mood and sleep disturbance (sleeps too much). Negative for self-injury and suicidal ideas. The patient is not nervous/anxious.     Social History  Substance Use Topics  . Smoking status: Never Smoker  . Smokeless tobacco: Never Used  . Alcohol use 0.0 oz/week     Comment: occasionally   Objective:   BP 122/80 (BP Location: Left Arm, Patient Position: Sitting, Cuff Size: Large)   Pulse 86   Temp 98.1 F (36.7 C) (Oral)   Resp 16   Wt 190 lb 6.4 oz (86.4 kg)   Physical Exam  Constitutional: He appears well-developed and well-nourished. No distress.  HENT:  Head: Normocephalic and atraumatic.  Cardiovascular: Normal rate, regular rhythm and normal heart sounds.  Exam reveals no gallop and no friction rub.   No murmur heard. Pulmonary/Chest: Effort normal and breath sounds normal. No respiratory distress. He has no wheezes. He has no rales.  Skin: He is not diaphoretic.  Psychiatric: His speech is normal and behavior is normal. Judgment and thought content normal. Cognition and memory are normal. He exhibits a depressed mood.  Vitals reviewed.     Assessment & Plan:     1. Depression He did the best with wellbutrin in the past so we will restart this as below. He is to call the office if he feels he may need  to increase to  as before. If he's doing well, I will see him back in 3 months to re-evaluate. We did discuss that if he is uncontrolled on a high dose that we may use another anti-depressant with wellbutrin. He agrees. He is to call if symptoms worsen in the meantime. - buPROPion (WELLBUTRIN SR) 150 MG 12 hr tablet; Take 1 tablet (150 mg total) by mouth 2 (two) times daily.  Dispense: 60 tablet; Refill: 3  2. Primary osteoarthritis of  left knee Stable. Diagnosis pulled for medication refill. Continue current medical treatment plan. Followed by Dr. Darrelyn Hillock, [facility] - meloxicam (MOBIC) 15 MG tablet; Take 1 tablet (15 mg total) by mouth daily.  Dispense: 30 tablet; Refill: 5  3. Lumbar herniated disc Followed by Dr. Darrelyn Hillock at [facility].       Margaretann Loveless, PA-C  [facility]

## 2016-05-08 NOTE — Patient Instructions (Signed)
Bupropion sustained-release tablets (Depression/Mood Disorders)  What is this medicine?  BUPROPION (byoo PROE pee on) is used to treat depression.  This medicine may be used for other purposes; ask your health care provider or pharmacist if you have questions.  What should I tell my health care provider before I take this medicine?  They need to know if you have any of these conditions:  -an eating disorder, such as anorexia or bulimia  -bipolar disorder or psychosis  -diabetes or high blood sugar, treated with medication  -glaucoma  -head injury or brain tumor  -heart disease, previous heart attack, or irregular heart beat  -high blood pressure  -kidney or liver disease  -seizures  -suicidal thoughts or a previous suicide attempt  -Tourette's syndrome  -weight loss  -an unusual or allergic reaction to bupropion, other medicines, foods, dyes, or preservatives  -breast-feeding  -pregnant or trying to become pregnant  How should I use this medicine?  Take this medicine by mouth with a glass of water. Follow the directions on the prescription label. You can take it with or without food. If it upsets your stomach, take it with food. Do not cut, crush or chew this medicine. Take your medicine at regular intervals. If you take this medicine more than once a day, take your second dose at least 8 hours after you take your first dose. To limit difficulty in sleeping, avoid taking this medicine at bedtime. Do not take your medicine more often than directed. Do not stop taking this medicine suddenly except upon the advice of your doctor. Stopping this medicine too quickly may cause serious side effects or your condition may worsen.  A special MedGuide will be given to you by the pharmacist with each prescription and refill. Be sure to read this information carefully each time.  Talk to your pediatrician regarding the use of this medicine in children. Special care may be needed.  Overdosage: If you think you have taken too much  of this medicine contact a poison control center or emergency room at once.  NOTE: This medicine is only for you. Do not share this medicine with others.  What if I miss a dose?  If you miss a dose, skip the missed dose and take your next tablet at the regular time. There should be at least 8 hours between doses. Do not take double or extra doses.  What may interact with this medicine?  Do not take this medicine with any of the following medications:  -linezolid  -MAOIs like Azilect, Carbex, Eldepryl, Marplan, Nardil, and Parnate  -methylene blue (injected into a vein)  -other medicines that contain bupropion like Zyban  This medicine may also interact with the following medications:  -alcohol  -certain medicines for anxiety or sleep  -certain medicines for blood pressure like metoprolol, propranolol  -certain medicines for depression or psychotic disturbances  -certain medicines for HIV or AIDS like efavirenz, lopinavir, nelfinavir, ritonavir  -certain medicines for irregular heart beat like propafenone, flecainide  -certain medicines for Parkinson's disease like amantadine, levodopa  -certain medicines for seizures like carbamazepine, phenytoin, phenobarbital  -cimetidine  -clopidogrel  -cyclophosphamide  -furazolidone  -isoniazid  -nicotine  -orphenadrine  -procarbazine  -steroid medicines like prednisone or cortisone  -stimulant medicines for attention disorders, weight loss, or to stay awake  -tamoxifen  -theophylline  -thiotepa  -ticlopidine  -tramadol  -warfarin  This list may not describe all possible interactions. Give your health care provider a list of all the medicines, herbs,   non-prescription drugs, or dietary supplements you use. Also tell them if you smoke, drink alcohol, or use illegal drugs. Some items may interact with your medicine.  What should I watch for while using this medicine?  Tell your doctor if your symptoms do not get better or if they get worse. Visit your doctor or health care  professional for regular checks on your progress. Because it may take several weeks to see the full effects of this medicine, it is important to continue your treatment as prescribed by your doctor.  Patients and their families should watch out for new or worsening thoughts of suicide or depression. Also watch out for sudden changes in feelings such as feeling anxious, agitated, panicky, irritable, hostile, aggressive, impulsive, severely restless, overly excited and hyperactive, or not being able to sleep. If this happens, especially at the beginning of treatment or after a change in dose, call your health care professional.  Avoid alcoholic drinks while taking this medicine. Drinking excessive alcoholic beverages, using sleeping or anxiety medicines, or quickly stopping the use of these agents while taking this medicine may increase your risk for a seizure.  Do not drive or use heavy machinery until you know how this medicine affects you. This medicine can impair your ability to perform these tasks.  Do not take this medicine close to bedtime. It may prevent you from sleeping.  Your mouth may get dry. Chewing sugarless gum or sucking hard candy, and drinking plenty of water may help. Contact your doctor if the problem does not go away or is severe.  What side effects may I notice from receiving this medicine?  Side effects that you should report to your doctor or health care professional as soon as possible:  -allergic reactions like skin rash, itching or hives, swelling of the face, lips, or tongue  -breathing problems  -changes in vision  -confusion  -fast or irregular heartbeat  -hallucinations  -increased blood pressure  -redness, blistering, peeling or loosening of the skin, including inside the mouth  -seizures  -suicidal thoughts or other mood changes  -unusually weak or tired  -vomiting  Side effects that usually do not require medical attention (report to your doctor or health care professional if they  continue or are bothersome):  -change in sex drive or performance  -constipation  -headache  -loss of appetite  -nausea  -tremors  -weight loss  This list may not describe all possible side effects. Call your doctor for medical advice about side effects. You may report side effects to FDA at 1-800-FDA-1088.  Where should I keep my medicine?  Keep out of the reach of children.  Store at room temperature between 20 and 25 degrees C (68 and 77 degrees F), away from direct sunlight and moisture. Keep tightly closed. Throw away any unused medicine after the expiration date.  NOTE: This sheet is a summary. It may not cover all possible information. If you have questions about this medicine, talk to your doctor, pharmacist, or health care provider.     © 2016, [employer]. (2013-04-11 12:41:10)

## 2016-06-28 ENCOUNTER — Encounter: Payer: Self-pay | Admitting: Physician Assistant

## 2016-06-28 ENCOUNTER — Ambulatory Visit (INDEPENDENT_AMBULATORY_CARE_PROVIDER_SITE_OTHER): Payer: BLUE CROSS/BLUE SHIELD | Admitting: Physician Assistant

## 2016-06-28 VITALS — BP 110/66 | HR 72 | Temp 98.0°F | Resp 16 | Ht 67.0 in | Wt 181.0 lb

## 2016-06-28 DIAGNOSIS — F329 Major depressive disorder, single episode, unspecified: Secondary | ICD-10-CM

## 2016-06-28 DIAGNOSIS — G4700 Insomnia, unspecified: Secondary | ICD-10-CM

## 2016-06-28 DIAGNOSIS — F32A Depression, unspecified: Secondary | ICD-10-CM

## 2016-06-28 MED ORDER — DULOXETINE HCL 20 MG PO CPEP: 20.0000 mg | ORAL_CAPSULE | Freq: Every day | ORAL | 1 refills | Status: DC

## 2016-06-28 MED ORDER — TRAZODONE HCL 50 MG PO TABS: 25.0000 mg | ORAL_TABLET | Freq: Every evening | ORAL | 3 refills | Status: DC | PRN

## 2016-06-28 NOTE — Progress Notes (Signed)
Patient: Phillip Santana Male    DOB: [birth year]   45 y.o.   MRN: [id number] Visit Date: 06/28/2016  Today's Provider: Margaretann LovelessJennifer M Burnette, PA-C   Chief Complaint  Patient presents with  . Depression   Subjective:    HPI  Depression, Follow-up  He  was last seen for this 7 weeks ago. Changes made at last visit include restarted Wellbutrin Sr 150 mg BID.   He reports excellent compliance with treatment. He is not having side effects.   He reports excellent tolerance of treatment. Current symptoms include: depressed mood and agitation He feels he is Unchanged since last visit. He reports he had a similar experience where the wellbutrin worked well initially, but then started losing effectiveness. New stressors are financial strain and marital strain due to his wife's disability claim being denied. They have been fighting more and that is causing increased stress and anxiety. States he doesn't even want to go to work and gets ill when just getting ready for work now.    Past medical history: Anxiety and Depression. He had no  response or increased drowsiness with zoloft, celexa, paxil,  prozac, and lexapro. He has tried effexor with limited  response and increased drowsiness. Had a decent response  on samples of Pristiq, but refills have been too expensive to  continue.  ------------------------------------------------------------------------   Not Hispanic or Latino  No Known Allergies   Current Outpatient Prescriptions:  .  buPROPion (WELLBUTRIN SR) 150 MG 12 hr tablet, Take 1 tablet (150 mg total) by mouth 2 (two) times daily., Disp: 60 tablet, Rfl: 3 .  Lysine 1000 MG TABS, Take 1 tablet by mouth daily., Disp: , Rfl:  .  triamcinolone cream (KENALOG) 0.1 %, Apply 1 application topically 2 (two) times daily., Disp: 30 g, Rfl: 0  Review of Systems  Constitutional: Negative.   Respiratory: Negative.   Cardiovascular: Negative.   Gastrointestinal: Negative.     Psychiatric/Behavioral: Positive for agitation, dysphoric mood and sleep disturbance. Negative for self-injury and suicidal ideas. The patient is nervous/anxious.     Social History  Substance Use Topics  . Smoking status: Never Smoker  . Smokeless tobacco: Never Used  . Alcohol use 7.2 oz/week    12 Cans of beer per week     Comment: occasionally   Objective:   BP 110/66 (BP Location: Left Arm, Patient Position: Sitting, Cuff Size: Large)   Pulse 72   Temp 98 F (36.7 C) (Oral)   Resp 16   Ht 5\' 7"  (1.702 m)   Wt 181 lb (82.1 kg)   BMI 28.35 kg/m   Physical Exam  Constitutional: He appears well-developed and well-nourished. No distress.  HENT:  Head: Normocephalic and atraumatic.  Neck: Normal range of motion. Neck supple. No tracheal deviation present. No thyromegaly present.  Cardiovascular: Normal rate, regular rhythm and normal heart sounds.  Exam reveals no gallop and no friction rub.   No murmur heard. Pulmonary/Chest: Effort normal and breath sounds normal. No respiratory distress. He has no wheezes. He has no rales.  Lymphadenopathy:    He has no cervical adenopathy.  Skin: He is not diaphoretic.  Psychiatric: He has a normal mood and affect. His behavior is normal. Judgment and thought content normal.  Was smiling in office and did not open up like he normally does; suspect because wife was here for an appt as well (they were in separate rooms)  Vitals reviewed.     Assessment &  Plan:     1. Clinical depression Will continue wellbutrin 150mg  BID and add cymbalta as below. He is to call if he has any adverse effect. I will see him back in 4 weeks to re-evaluate.  - DULoxetine (CYMBALTA) 20 MG capsule; Take 1 capsule (20 mg total) by mouth daily.  Dispense: 30 capsule; Refill: 1  2. Insomnia Stable on trazodone 50-100mg  at bedtime. 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 Will monitor. - traZODone (DESYREL) 50 MG tablet; Take 0.5-1 tablets (25-50 mg total) by mouth at bedtime as needed for sleep.  Dispense: 60 tablet; Refill: 3       Margaretann Loveless, PA-C  [facility]

## 2016-06-28 NOTE — Patient Instructions (Signed)
Duloxetine delayed-release capsules  What is this medicine?  DULOXETINE (doo LOX e teen) is used to treat depression, anxiety, and different types of chronic pain.  This medicine may be used for other purposes; ask your health care provider or pharmacist if you have questions.  What should I tell my health care provider before I take this medicine?  They need to know if you have any of these conditions:  -bipolar disorder or a family history of bipolar disorder  -glaucoma  -kidney disease  -liver disease  -suicidal thoughts or a previous suicide attempt  -taken medicines called MAOIs like Carbex, Eldepryl, Marplan, Nardil, and Parnate within 14 days  -an unusual reaction to duloxetine, other medicines, foods, dyes, or preservatives  -pregnant or trying to get pregnant  -breast-feeding  How should I use this medicine?  Take this medicine by mouth with a glass of water. Follow the directions on the prescription label. Do not cut, crush or chew this medicine. You can take this medicine with or without food. Take your medicine at regular intervals. Do not take your medicine more often than directed. Do not stop taking this medicine suddenly except upon the advice of your doctor. Stopping this medicine too quickly may cause serious side effects or your condition may worsen.  A special MedGuide will be given to you by the pharmacist with each prescription and refill. Be sure to read this information carefully each time.  Talk to your pediatrician regarding the use of this medicine in children. While this drug may be prescribed for children as young as 7 years of age for selected conditions, precautions do apply.  Overdosage: If you think you have taken too much of this medicine contact a poison control center or emergency room at once.  NOTE: This medicine is only for you. Do not share this medicine with others.  What if I miss a dose?  If you miss a dose, take it as soon as you can. If it is almost time for your next  dose, take only that dose. Do not take double or extra doses.  What may interact with this medicine?  Do not take this medicine with any of the following medications:  -certain diet drugs like dexfenfluramine, fenfluramine  -desvenlafaxine  -linezolid  -MAOIs like Azilect, Carbex, Eldepryl, Marplan, Nardil, and Parnate  -methylene blue (intravenous)  -milnacipran  -thioridazine  -venlafaxine  This medicine may also interact with the following medications:  -alcohol  -aspirin and aspirin-like medicines  -certain antibiotics like ciprofloxacin and enoxacin  -certain medicines for blood pressure, heart disease, irregular heart beat  -certain medicines for depression, anxiety, or psychotic disturbances  -certain medicines for migraine headache like almotriptan, eletriptan, frovatriptan, naratriptan, rizatriptan, sumatriptan, zolmitriptan  -certain medicines that treat or prevent blood clots like warfarin, enoxaparin, and dalteparin  -cimetidine  -fentanyl  -lithium  -NSAIDS, medicines for pain and inflammation, like ibuprofen or naproxen  -phentermine  -procarbazine  -sibutramine  -St. John's wort  -theophylline  -tramadol  -tryptophan  This list may not describe all possible interactions. Give your health care provider a list of all the medicines, herbs, non-prescription drugs, or dietary supplements you use. Also tell them if you smoke, drink alcohol, or use illegal drugs. Some items may interact with your medicine.  What should I watch for while using this medicine?  Tell your doctor if your symptoms do not get better or if they get worse. Visit your doctor or health care professional for regular checks on your progress.   Because it may take several weeks to see the full effects of this medicine, it is important to continue your treatment as prescribed by your doctor.  Patients and their families should watch out for new or worsening thoughts of suicide or depression. Also watch out for sudden changes in feelings such  as feeling anxious, agitated, panicky, irritable, hostile, aggressive, impulsive, severely restless, overly excited and hyperactive, or not being able to sleep. If this happens, especially at the beginning of treatment or after a change in dose, call your health care professional.  You may get drowsy or dizzy. Do not drive, use machinery, or do anything that needs mental alertness until you know how this medicine affects you. Do not stand or sit up quickly, especially if you are an older patient. This reduces the risk of dizzy or fainting spells. Alcohol may interfere with the effect of this medicine. Avoid alcoholic drinks.  This medicine can cause an increase in blood pressure. This medicine can also cause a sudden drop in your blood pressure, which may make you feel faint and increase the chance of a fall. These effects are most common when you first start the medicine or when the dose is increased, or during use of other medicines that can cause a sudden drop in blood pressure. Check with your doctor for instructions on monitoring your blood pressure while taking this medicine.  Your mouth may get dry. Chewing sugarless gum or sucking hard candy, and drinking plenty of water may help. Contact your doctor if the problem does not go away or is severe.  What side effects may I notice from receiving this medicine?  Side effects that you should report to your doctor or health care professional as soon as possible:  -allergic reactions like skin rash, itching or hives, swelling of the face, lips, or tongue  -changes in blood pressure  -confusion  -dark urine  -dizziness  -fast talking and excited feelings or actions that are out of control  -fast, irregular heartbeat  -fever  -general ill feeling or flu-like symptoms  -hallucination, loss of contact with reality  -light-colored stools  -loss of balance or coordination  -redness, blistering, peeling or loosening of the skin, including inside the mouth  -right upper  belly pain  -seizures  -suicidal thoughts or other mood changes  -trouble concentrating  -trouble passing urine or change in the amount of urine  -unusual bleeding or bruising  -unusually weak or tired  -yellowing of the eyes or skin  Side effects that usually do not require medical attention (report to your doctor or health care professional if they continue or are bothersome):  -blurred vision  -change in appetite  -change in sex drive or performance  -headache  -increased sweating  -nausea  This list may not describe all possible side effects. Call your doctor for medical advice about side effects. You may report side effects to FDA at 1-800-FDA-1088.  Where should I keep my medicine?  Keep out of the reach of children.  Store at room temperature between 20 and 25 degrees C (68 to 77 degrees F). Throw away any unused medicine after the expiration date.  NOTE: This sheet is a summary. It may not cover all possible information. If you have questions about this medicine, talk to your doctor, pharmacist, or health care provider.     © 2016, [employer]. (2013-09-09 16:28:32)

## 2016-07-26 ENCOUNTER — Ambulatory Visit: Payer: Self-pay | Admitting: Physician Assistant

## 2016-09-16 ENCOUNTER — Other Ambulatory Visit: Payer: Self-pay | Admitting: Physician Assistant

## 2016-09-16 DIAGNOSIS — F32A Depression, unspecified: Secondary | ICD-10-CM

## 2016-09-16 DIAGNOSIS — F329 Major depressive disorder, single episode, unspecified: Secondary | ICD-10-CM

## 2016-10-03 ENCOUNTER — Other Ambulatory Visit: Payer: Self-pay | Admitting: Physician Assistant

## 2016-10-03 DIAGNOSIS — F32A Depression, unspecified: Secondary | ICD-10-CM

## 2016-10-03 DIAGNOSIS — F329 Major depressive disorder, single episode, unspecified: Secondary | ICD-10-CM

## 2016-10-03 NOTE — Telephone Encounter (Signed)
Last ov 06/28/16 1. Clinical depression Will continue wellbutrin 150mg  BID and add cymbalta as below. He is to call if he has any adverse effect. I will see him back in 4 weeks to re-evaluate.  - DULoxetine (CYMBALTA) 20 MG capsule; Take 1 capsule (20 mg total) by mouth daily.  Dispense: 30 capsule; Refill: 1 Patient does not have a fu appointment.  Please review. Thank you.

## 2016-10-17 ENCOUNTER — Ambulatory Visit (INDEPENDENT_AMBULATORY_CARE_PROVIDER_SITE_OTHER): Payer: BLUE CROSS/BLUE SHIELD | Admitting: Physician Assistant

## 2016-10-17 ENCOUNTER — Telehealth: Payer: Self-pay | Admitting: Physician Assistant

## 2016-10-17 ENCOUNTER — Encounter: Payer: Self-pay | Admitting: Physician Assistant

## 2016-10-17 VITALS — BP 128/90 | HR 104 | Temp 98.8°F | Resp 16 | Wt 194.0 lb

## 2016-10-17 DIAGNOSIS — R059 Cough, unspecified: Secondary | ICD-10-CM

## 2016-10-17 DIAGNOSIS — J40 Bronchitis, not specified as acute or chronic: Secondary | ICD-10-CM

## 2016-10-17 DIAGNOSIS — R05 Cough: Secondary | ICD-10-CM | POA: Diagnosis not present

## 2016-10-17 MED ORDER — BENZONATATE 200 MG PO CAPS: 200.0000 mg | ORAL_CAPSULE | Freq: Three times a day (TID) | ORAL | 0 refills | Status: DC | PRN

## 2016-10-17 MED ORDER — PREDNISONE 10 MG (21) PO TBPK: ORAL_TABLET | ORAL | 0 refills | Status: DC

## 2016-10-17 MED ORDER — AZITHROMYCIN 250 MG PO TABS: ORAL_TABLET | ORAL | 0 refills | Status: DC

## 2016-10-17 NOTE — Telephone Encounter (Signed)
He told me that it kept him up when he used that. Plus I am out of the office and cannot sign this until we are back in the office. Possibly tomorrow after 10am as of now.

## 2016-10-17 NOTE — Patient Instructions (Signed)

## 2016-10-17 NOTE — Telephone Encounter (Signed)
Advised patient's wife as below. She reports that she is fine with him taking it if that is what he told you. Now that she thinks about it, she can not remember if the tessalon perles helped or not. I recommended that if his symptoms continued to worsen to call the office.

## 2016-10-17 NOTE — Telephone Encounter (Signed)
Pt's wife called saying the tessalon pearls that was prescribed for her husband today at his visit does not work.  She would like for him to have the codeine cough medication that you had given her when she was sick last year with bronchitis.  Wives call back is [phone]  Thanks, Barth Kirkseri

## 2016-10-17 NOTE — Progress Notes (Signed)
Patient: Phillip Santana Male    DOB: March 04, 1971   46 y.o.   MRN: [id number] Visit Date: 10/17/2016  Today's Provider: Margaretann LovelessJennifer M Caleen Taaffe, PA-C   Chief Complaint  Patient presents with  . URI   Subjective:    HPI Upper Respiratory Infection: Patient complains of symptoms of a URI. Symptoms include congestion, cough, fever, plugged sensation in both ears and sore throat. Onset of symptoms was 4 day ago, gradually worsening since that time. He also c/o low grade fever and purulent nasal discharge for the past 1 day .  He is drinking plenty of fluids. Evaluation to date: none. Treatment to date: cough suppressants and decongestants.     No Known Allergies Patient Active Problem List   Diagnosis Date Noted  . Primary osteoarthritis of left knee 05/08/2016  . Lumbar herniated disc 05/08/2016  . Hypercholesterolemia with hypertriglyceridemia 02/01/2016  . Night sweats 09/07/2015  . Depression 04/12/2015  . Alcohol abuse, episodic 03/10/2015  . Anxiety 03/10/2015  . Clinical depression 03/10/2015  . Blood pressure elevated 03/10/2015  . Malaise and fatigue 03/03/2010  . Generalized muscle weakness 03/03/2010  . Visual disturbance 06/23/2008     Current Outpatient Prescriptions:  .  buPROPion (WELLBUTRIN SR) 150 MG 12 hr tablet, TAKE 1 TABLET BY MOUTH TWICE A DAY, Disp: 60 tablet, Rfl: 5 .  DULoxetine (CYMBALTA) 20 MG capsule, TAKE 1 CAPSULE BY MOUTH DAILY, Disp: 30 capsule, Rfl: 5 .  Lysine 1000 MG TABS, Take 1 tablet by mouth daily., Disp: , Rfl:  .  traZODone (DESYREL) 50 MG tablet, Take 0.5-1 tablets (25-50 mg total) by mouth at bedtime as needed for sleep., Disp: 60 tablet, Rfl: 3  Review of Systems  Constitutional: Positive for fatigue. Negative for chills and fever.  HENT: Positive for congestion, ear pain, rhinorrhea and sore throat. Negative for sinus pain, sinus pressure, sneezing and tinnitus.   Respiratory: Positive for cough and wheezing. Negative for chest  tightness and shortness of breath.   Cardiovascular: Negative.   Gastrointestinal: Negative.   Neurological: Negative.     Social History  Substance Use Topics  . Smoking status: Never Smoker  . Smokeless tobacco: Never Used  . Alcohol use 7.2 oz/week    12 Cans of beer per week     Comment: occasionally   Objective:   BP 128/90 (BP Location: Left Arm, Patient Position: Sitting, Cuff Size: Large)   Pulse (!) 104   Temp 98.8 F (37.1 C) (Oral)   Resp 16   Wt 194 lb (88 kg)   SpO2 95%   BMI 30.38 kg/m   Physical Exam  Constitutional: He appears well-developed and well-nourished. No distress.  HENT:  Head: Normocephalic and atraumatic.  Right Ear: Hearing, tympanic membrane, external ear and ear canal normal. Tympanic membrane is not erythematous and not bulging. No middle ear effusion.  Left Ear: Hearing, tympanic membrane, external ear and ear canal normal. Tympanic membrane is not erythematous and not bulging.  No middle ear effusion.  Nose: Mucosal edema and rhinorrhea present. Right sinus exhibits no maxillary sinus tenderness and no frontal sinus tenderness. Left sinus exhibits no maxillary sinus tenderness and no frontal sinus tenderness.  Mouth/Throat: Uvula is midline and mucous membranes are normal. Posterior oropharyngeal erythema present. No oropharyngeal exudate or posterior oropharyngeal edema.  Eyes: Conjunctivae and EOM are normal. Pupils are equal, round, and reactive to light. Right eye exhibits no discharge. Left eye exhibits no discharge.  Neck: Normal range  of motion. Neck supple. No tracheal deviation present. No Brudzinski's sign and no Kernig's sign noted. No thyromegaly present.  Cardiovascular: Normal rate, regular rhythm and normal heart sounds.  Exam reveals no gallop and no friction rub.   No murmur heard. Pulmonary/Chest: Effort normal. No stridor. No respiratory distress. He has decreased breath sounds. He has no wheezes. He has no rales.    Lymphadenopathy:    He has no cervical adenopathy.  Skin: Skin is warm and dry. He is not diaphoretic.  Vitals reviewed.      Assessment & Plan:     1. Bronchitis wheezes have been heard the patient has been wheezing and having chest tightness. I will treat as bronchitis. Patient has had symptoms for over a week has not responded to over-the-counter medications. I will treat with Z-Pak and prednisone as below. Patient may use Mucinex DM for congestion. He is to call the office if symptoms worsen. - azithromycin (ZITHROMAX) 250 MG tablet; Take 2 tablets PO on day one, and one tablet PO daily thereafter until completed.  Dispense: 6 tablet; Refill: 0 - predniSONE (STERAPRED UNI-PAK 21 TAB) 10 MG (21) TBPK tablet; Take as directed on package instructions  Dispense: 21 tablet; Refill: 0  2. Cough Worsening symptoms that has not responded to OTC medications. Will give Tessalon perles for cough. He needs to push fluids. - benzonatate (TESSALON) 200 MG capsule; Take 1 capsule (200 mg total) by mouth 3 (three) times daily as needed for cough.  Dispense: 30 capsule; Refill: 0       Margaretann Loveless, PA-C  [facility]

## 2016-10-17 NOTE — Telephone Encounter (Signed)
Please review. Thanks!  

## 2017-01-15 ENCOUNTER — Encounter: Payer: Self-pay | Admitting: Physician Assistant

## 2017-01-15 ENCOUNTER — Ambulatory Visit (INDEPENDENT_AMBULATORY_CARE_PROVIDER_SITE_OTHER): Payer: BLUE CROSS/BLUE SHIELD | Admitting: Physician Assistant

## 2017-01-15 VITALS — BP 120/78 | HR 99 | Temp 98.1°F | Resp 16 | Wt 194.0 lb

## 2017-01-15 DIAGNOSIS — F39 Unspecified mood [affective] disorder: Secondary | ICD-10-CM

## 2017-01-15 DIAGNOSIS — F339 Major depressive disorder, recurrent, unspecified: Secondary | ICD-10-CM

## 2017-01-15 DIAGNOSIS — M5441 Lumbago with sciatica, right side: Secondary | ICD-10-CM | POA: Diagnosis not present

## 2017-01-15 DIAGNOSIS — M5442 Lumbago with sciatica, left side: Secondary | ICD-10-CM | POA: Diagnosis not present

## 2017-01-15 DIAGNOSIS — R4586 Emotional lability: Secondary | ICD-10-CM

## 2017-01-15 MED ORDER — DULOXETINE HCL 60 MG PO CPEP: 60.0000 mg | ORAL_CAPSULE | Freq: Every day | ORAL | 5 refills | Status: DC

## 2017-01-15 MED ORDER — ARIPIPRAZOLE 5 MG PO TABS: 5.0000 mg | ORAL_TABLET | Freq: Every day | ORAL | 5 refills | Status: DC

## 2017-01-15 MED ORDER — METHYLPREDNISOLONE 4 MG PO TBPK: ORAL_TABLET | ORAL | 0 refills | Status: DC

## 2017-01-15 NOTE — Patient Instructions (Signed)
Aripiprazole tablets What is this medicine? ARIPIPRAZOLE (ay ri PIP ray zole) is an atypical antipsychotic. It is used to treat schizophrenia and bipolar disorder, also known as manic-depression. It is also used to treat Tourette's disorder and some symptoms of autism. This medicine may also be used in combination with antidepressants to treat major depressive disorder. This medicine may be used for other purposes; ask your health care provider or pharmacist if you have questions. COMMON BRAND NAME(S): Abilify What should I tell my health care provider before I take this medicine? They need to know if you have any of these conditions: -dehydration -dementia -diabetes -heart disease -history of stroke -low blood counts, like low white cell, platelet, or red cell counts -Parkinson's disease -seizures -suicidal thoughts, plans, or attempt; a previous suicide attempt by you or a family member -an unusual or allergic reaction to aripiprazole, other medicines, foods, dyes, or preservatives -pregnant or trying to get pregnant -breast-feeding How should I use this medicine? Take this medicine by mouth with a glass of water. Follow the directions on the prescription label. You can take this medicine with or without food. Take your doses at regular intervals. Do not take your medicine more often than directed. Do not stop taking except on the advice of your doctor or health care professional. A special MedGuide will be given to you by the pharmacist with each prescription and refill. Be sure to read this information carefully each time. Talk to your pediatrician regarding the use of this medicine in children. While this drug may be prescribed for children as young as 6 years of age for selected conditions, precautions do apply. Overdosage: If you think you have taken too much of this medicine contact a poison control center or emergency room at once. NOTE: This medicine is only for you. Do not share  this medicine with others. What if I miss a dose? If you miss a dose, take it as soon as you can. If it is almost time for your next dose, take only that dose. Do not take double or extra doses. What may interact with this medicine? Do not take this medicine with any of the following medications: -brexpiprazole -cisapride -dofetilide -dronedarone -metoclopramide -pimozide -thioridazine This medicine may also interact with the following medications: -alcohol -carbamazepine -certain medicines for anxiety or sleep -certain medicines for blood pressure -certain medicines for fungal infections like ketoconazole, fluconazole, posaconazole, and itraconazole -clarithromycin -fluoxetine -other medicines that prolong the QT interval (cause an abnormal heart rhythm) -paroxetine -quinidine -rifampin This list may not describe all possible interactions. Give your health care provider a list of all the medicines, herbs, non-prescription drugs, or dietary supplements you use. Also tell them if you smoke, drink alcohol, or use illegal drugs. Some items may interact with your medicine. What should I watch for while using this medicine? Visit your doctor or health care professional for regular checks on your progress. It may be several weeks before you see the full effects of this medicine. Do not suddenly stop taking this medicine. You may need to gradually reduce the dose. Patients and their families should watch out for worsening depression or thoughts of suicide. Also watch out for sudden changes in feelings such as feeling anxious, agitated, panicky, irritable, hostile, aggressive, impulsive, severely restless, overly excited and hyperactive, or not being able to sleep. If this happens, especially at the beginning of antidepressant treatment or after a change in dose, call your health care professional. You may get dizzy or drowsy. Do   not drive, use machinery, or do anything that needs mental  alertness until you know how this medicine affects you. Do not stand or sit up quickly, especially if you are an older patient. This reduces the risk of dizzy or fainting spells. Alcohol can increase dizziness and drowsiness. Avoid alcoholic drinks. This medicine can reduce the response of your body to heat or cold. Dress warm in cold weather and stay hydrated in hot weather. If possible, avoid extreme temperatures like saunas, hot tubs, very hot or cold showers, or activities that can cause dehydration such as vigorous exercise. This medicine may cause dry eyes and blurred vision. If you wear contact lenses you may feel some discomfort. Lubricating drops may help. See your eye doctor if the problem does not go away or is severe. If you notice an increased hunger or thirst, different from your normal hunger or thirst, or if you find that you have to urinate more frequently, you should contact your health care provider as soon as possible. You may need to have your blood sugar monitored. This medicine may cause changes in your blood sugar levels. You should monitor you blood sugar frequently if you have diabetes. There have been reports of uncontrollable and strong urges to gamble, binge eat, shop, and have sex while taking this medicine. If you experience any of these or other uncontrollable and strong urges while taking this medicine, you should report it to your health care provider as soon as possible. What side effects may I notice from receiving this medicine? Side effects that you should report to your doctor or health care professional as soon as possible: -allergic reactions like skin rash, itching or hives, swelling of the face, lips, or tongue -breathing problems -confusion -feeling faint or lightheaded, falls -fever or chills, sore throat -increased hunger or thirst -increased urination -joint pain -muscles pain, spasms -problems with balance, talking, walking -restlessness or need to  keep moving -seizures -suicidal thoughts or other mood changes -trouble swallowing -uncontrollable and excessive urges (examples: gambling, binge eating, shopping, having sex) -uncontrollable head, mouth, neck, arm, or leg movements -unusually weak or tired Side effects that usually do not require medical attention (report to your doctor or health care professional if they continue or are bothersome): -blurred vision -constipation -headache -nausea, vomiting -trouble sleeping -weight gain This list may not describe all possible side effects. Call your doctor for medical advice about side effects. You may report side effects to FDA at 1-800-FDA-1088. Where should I keep my medicine? Keep out of the reach of children. Store at room temperature between 15 and 30 degrees C (59 and 86 degrees F). Throw away any unused medicine after the expiration date. NOTE: This sheet is a summary. It may not cover all possible information. If you have questions about this medicine, talk to your doctor, pharmacist, or health care provider.  2018 Elsevier/Gold Standard (2016-09-03 11:45:05)  

## 2017-01-15 NOTE — Progress Notes (Signed)
Patient: Phillip Santana Male    DOB: [birth year]   46 y.o.   MRN: [id number] Visit Date: 01/15/2017  Today's Provider: Margaretann Loveless, PA-C   Chief Complaint  Patient presents with  . Depression  . Back Pain   Subjective:    Depression         This is a chronic problem.  The problem occurs constantly.  Associated symptoms include decreased concentration, fatigue, irritable, body aches and sad.  Associated symptoms include no headaches and no suicidal ideas.( Patient reports feeling ill(angry) all the time. Gets agitated very quickly with the simple things.)     The symptoms are aggravated by family issues.  Treatments tried: He is on Wellbutrin and Cymbalta.  Compliance with treatment is good. Back Pain  The current episode started more than 1 month ago. The problem occurs constantly. The problem has been gradually worsening since onset. The pain is present in the lumbar spine (Hx of pain from the sciatic nerve). The quality of the pain is described as aching and shooting. The pain radiates to the right foot, right knee, right thigh, left knee, left thigh and left foot (He reports pain in arms for the past 2 months and tingling on his finger tips "like an electric shock" specially when he is working out.). The pain is moderate. The pain is the same all the time. The symptoms are aggravated by standing, sitting and position. Associated symptoms include leg pain, numbness and tingling. Pertinent negatives include no chest pain or headaches. Treatments tried: Per patient he has been taking Percocet PRN. The treatment provided no relief.  The percocet he has been using is his wife's. He does also report having increased stress due to financial reasons and family issues. He does report his wife is wanting him to move out.     No Known Allergies   Current Outpatient Prescriptions:  .  buPROPion (WELLBUTRIN SR) 150 MG 12 hr tablet, TAKE 1 TABLET BY MOUTH TWICE A DAY, Disp: 60 tablet, Rfl:  5 .  DULoxetine (CYMBALTA) 20 MG capsule, TAKE 1 CAPSULE BY MOUTH DAILY, Disp: 30 capsule, Rfl: 5 .  Lysine 1000 MG TABS, Take 1 tablet by mouth daily., Disp: , Rfl:  .  traZODone (DESYREL) 50 MG tablet, Take 0.5-1 tablets (25-50 mg total) by mouth at bedtime as needed for sleep., Disp: 60 tablet, Rfl: 3 .  azithromycin (ZITHROMAX) 250 MG tablet, Take 2 tablets PO on day one, and one tablet PO daily thereafter until completed. (Patient not taking: Reported on 01/15/2017), Disp: 6 tablet, Rfl: 0 .  benzonatate (TESSALON) 200 MG capsule, Take 1 capsule (200 mg total) by mouth 3 (three) times daily as needed for cough. (Patient not taking: Reported on 01/15/2017), Disp: 30 capsule, Rfl: 0 .  predniSONE (STERAPRED UNI-PAK 21 TAB) 10 MG (21) TBPK tablet, Take as directed on package instructions (Patient not taking: Reported on 01/15/2017), Disp: 21 tablet, Rfl: 0  Review of Systems  Constitutional: Positive for fatigue.  Respiratory: Negative.   Cardiovascular: Negative for chest pain, palpitations and leg swelling.  Gastrointestinal: Negative.   Musculoskeletal: Positive for back pain.  Neurological: Positive for tingling and numbness. Negative for dizziness and headaches.  Psychiatric/Behavioral: Positive for agitation (very quickly with little things), decreased concentration, depression, dysphoric mood and sleep disturbance (takes Trazadone PRN). Negative for self-injury and suicidal ideas.    Social History  Substance Use Topics  . Smoking status: Never Smoker  . Smokeless  tobacco: Never Used  . Alcohol use 7.2 oz/week    12 Cans of beer per week     Comment: occasionally   Objective:   BP 120/78 (BP Location: Left Arm, Patient Position: Sitting, Cuff Size: Normal)   Pulse 99   Temp 98.1 F (36.7 C) (Oral)   Resp 16   Wt 194 lb (88 kg)   BMI 30.38 kg/m    Physical Exam  Constitutional: He appears well-developed and well-nourished. He is irritable. No distress.  HENT:  Head:  Normocephalic and atraumatic.  Neck: Normal range of motion. Neck supple. No JVD present. No tracheal deviation present. No thyromegaly present.  Cardiovascular: Normal rate, regular rhythm and normal heart sounds.  Exam reveals no gallop and no friction rub.   No murmur heard. Pulmonary/Chest: Effort normal and breath sounds normal. No respiratory distress. He has no wheezes. He has no rales.  Lymphadenopathy:    He has no cervical adenopathy.  Skin: He is not diaphoretic.  Psychiatric: His speech is normal and behavior is normal. Judgment and thought content normal. Cognition and memory are normal. He exhibits a depressed mood. He expresses no suicidal plans and no homicidal plans.  Vitals reviewed.     Assessment & Plan:     1. Depression, recurrent (HCC) Worsening symptoms. Will increase cymbalta as below. Taper wellbutrin to once daily x one week then discontinue. Start abilify once wellbutrin is discontinued. Cymbalta may also help nerve pain at the higher dose as well. I will see him back in 4 weeks to see how he is doing.  - DULoxetine (CYMBALTA) 60 MG capsule; Take 1 capsule (60 mg total) by mouth daily.  Dispense: 30 capsule; Refill: 5 - ARIPiprazole (ABILIFY) 5 MG tablet; Take 1 tablet (5 mg total) by mouth daily.  Dispense: 30 tablet; Refill: 5  2. Mood swing (HCC) See above medical treatment plan. - ARIPiprazole (ABILIFY) 5 MG tablet; Take 1 tablet (5 mg total) by mouth daily.  Dispense: 30 tablet; Refill: 5  3. Acute bilateral low back pain with bilateral sciatica Will give medrol dose pak taper as below for back pain. Discussed PT and imaging. Patient reports he was seen by Dr. Darrelyn Hillock and had imaging done there last year. Requested records. He reports once he is more financially stable he is wanting to go back to see him.Will recheck in 4 weeks.  - methylPREDNISolone (MEDROL DOSEPAK) 4 MG TBPK tablet; 6 day taper  Dispense: 21 tablet; Refill: 0       Margaretann Loveless,  PA-C  [facility]

## 2017-01-30 ENCOUNTER — Encounter: Payer: Self-pay | Admitting: Physician Assistant

## 2017-02-12 ENCOUNTER — Encounter: Payer: Self-pay | Admitting: Physician Assistant

## 2017-02-12 ENCOUNTER — Ambulatory Visit (INDEPENDENT_AMBULATORY_CARE_PROVIDER_SITE_OTHER): Payer: BLUE CROSS/BLUE SHIELD | Admitting: Physician Assistant

## 2017-02-12 VITALS — BP 120/90 | HR 94 | Temp 98.2°F | Resp 16 | Wt 194.8 lb

## 2017-02-12 DIAGNOSIS — M1712 Unilateral primary osteoarthritis, left knee: Secondary | ICD-10-CM | POA: Diagnosis not present

## 2017-02-12 DIAGNOSIS — M7581 Other shoulder lesions, right shoulder: Secondary | ICD-10-CM

## 2017-02-12 DIAGNOSIS — M5442 Lumbago with sciatica, left side: Secondary | ICD-10-CM | POA: Diagnosis not present

## 2017-02-12 DIAGNOSIS — F320 Major depressive disorder, single episode, mild: Secondary | ICD-10-CM | POA: Diagnosis not present

## 2017-02-12 DIAGNOSIS — M7711 Lateral epicondylitis, right elbow: Secondary | ICD-10-CM

## 2017-02-12 DIAGNOSIS — M778 Other enthesopathies, not elsewhere classified: Secondary | ICD-10-CM

## 2017-02-12 DIAGNOSIS — F419 Anxiety disorder, unspecified: Secondary | ICD-10-CM

## 2017-02-12 DIAGNOSIS — M5441 Lumbago with sciatica, right side: Secondary | ICD-10-CM | POA: Diagnosis not present

## 2017-02-12 DIAGNOSIS — M7521 Bicipital tendinitis, right shoulder: Secondary | ICD-10-CM

## 2017-02-12 MED ORDER — METHYLPREDNISOLONE ACETATE 80 MG/ML IJ SUSP
80.0000 mg | Freq: Once | INTRAMUSCULAR | Status: AC
  Administered 2017-02-12: 80 mg via INTRA_ARTICULAR

## 2017-02-12 MED ORDER — METHYLPREDNISOLONE 4 MG PO TBPK: ORAL_TABLET | ORAL | 0 refills | Status: DC

## 2017-02-12 NOTE — Progress Notes (Signed)
Patient: Phillip Santana Male    DOB: May 03, 1971   46 y.o.   MRN: [id number] Visit Date: 02/12/2017  Today's Provider: Margaretann Loveless, PA-C   Chief Complaint  Patient presents with  . Follow-up    Depression   Subjective:    HPIPatient is here today for 4 week Depression. Patient symptoms worsening in the LOV. Cymbalta was increased and taper Wellbutrin until D/C. Sart Abilify. He reports that he is actually feeling well. He is more calm, anger is better. He reports that he stopped the Abilify right after he started it because he was feeling drowsy. Reports that at first he thought it was the Abilify, but now doesn't know if it is the Cymbalta because he still feels drowsy.   Back Pain: Patient is here for 4 week follow-up. He was started on 4 mg Methylprednisolone taper.Reports is the same. No improvement. He wants to see the Orthopedics.  Patient is also complaining about pain in his right arm. It hurts with grasping and when he lift his arm up. He reports having trouble getting his arm to 90 degree abduction but no pain going higher. Also having pain with bench press. He also reports having some tenderness at work (repetitive motion) over the lateral epicondyle of the same arm.      No Known Allergies   Current Outpatient Prescriptions:  .  DULoxetine (CYMBALTA) 60 MG capsule, Take 1 capsule (60 mg total) by mouth daily., Disp: 30 capsule, Rfl: 5 .  Lysine 1000 MG TABS, Take 1 tablet by mouth daily., Disp: , Rfl:  .  traZODone (DESYREL) 50 MG tablet, Take 0.5-1 tablets (25-50 mg total) by mouth at bedtime as needed for sleep., Disp: 60 tablet, Rfl: 3 .  ARIPiprazole (ABILIFY) 5 MG tablet, Take 1 tablet (5 mg total) by mouth daily. (Patient not taking: Reported on 02/12/2017), Disp: 30 tablet, Rfl: 5 .  methylPREDNISolone (MEDROL DOSEPAK) 4 MG TBPK tablet, 6 day taper (Patient not taking: Reported on 02/12/2017), Disp: 21 tablet, Rfl: 0  Review of Systems  Constitutional:  Negative.   Respiratory: Negative.   Cardiovascular: Negative.   Gastrointestinal: Negative.   Musculoskeletal: Positive for arthralgias, back pain (much improved and not radiating down legs at this time) and myalgias. Negative for joint swelling, neck pain and neck stiffness.  Neurological: Negative.   Psychiatric/Behavioral: Positive for dysphoric mood and sleep disturbance. Negative for self-injury and suicidal ideas.    Social History  Substance Use Topics  . Smoking status: Never Smoker  . Smokeless tobacco: Never Used  . Alcohol use 7.2 oz/week    12 Cans of beer per week     Comment: occasionally   Objective:   BP 120/90 (BP Location: Right Arm, Patient Position: Sitting, Cuff Size: Normal)   Pulse 94   Temp 98.2 F (36.8 C) (Oral)   Resp 16   Wt 194 lb 12.8 oz (88.4 kg)   BMI 30.51 kg/m     Physical Exam  Constitutional: He appears well-developed and well-nourished. No distress.  HENT:  Head: Normocephalic and atraumatic.  Neck: Normal range of motion. Neck supple. No JVD present. No tracheal deviation present. No thyromegaly present.  Cardiovascular: Normal rate, regular rhythm and normal heart sounds.  Exam reveals no gallop and no friction rub.   No murmur heard. Pulmonary/Chest: Effort normal and breath sounds normal. No respiratory distress. He has no wheezes. He has no rales.  Musculoskeletal:  Right shoulder: He exhibits tenderness (tender over deltoid insertion and biceps long head). He exhibits normal range of motion, no bony tenderness, no swelling, no effusion, no crepitus, no pain, no spasm, normal pulse and normal strength.       Right elbow: He exhibits normal range of motion, no swelling, no effusion, no deformity and no laceration. Tenderness found. Lateral epicondyle tenderness noted.  Lymphadenopathy:    He has no cervical adenopathy.  Skin: He is not diaphoretic.  Psychiatric: He has a normal mood and affect. His behavior is normal. Judgment  and thought content normal.  Vitals reviewed.     Assessment & Plan:     1. Depression, major, single episode, mild (HCC) Improved with Cymbalta 60mg . Will hold off on abilify now due to drowsiness. If drowsiness becomes to hard for him to overcome may consider lowering cymbalta to 30mg  and adding abilify. He voices understanding.   2. Anxiety Stable. See above medical treatment plan.  3. Biceps tendonitis on right Will give medrol dose pak as below for R upper extremity issues. Advised to not lift heavy to give his body time to heal. Call his orthopedic, Dr. Darrelyn HillockGioffre, for further evaluation.  - methylPREDNISolone (MEDROL DOSEPAK) 4 MG TBPK tablet; 6 day taper  Dispense: 21 tablet; Refill: 0  4. Deltoid tendinitis of right shoulder See above medical treatment plan.  5. Acute bilateral low back pain with bilateral sciatica Much improved at this time.   6. Primary osteoarthritis of left knee Knee injection given today in the office without complication. See below procedure note.  - methylPREDNISolone acetate (DEPO-MEDROL) injection 80 mg; Inject 1 mL (80 mg total) into the articular space once.  7. Lateral epicondylitis of right elbow See above medical treatment plan for #3.    Procedure Note:  Benefits, risks (including infection, tattooing, adipose dimpling, and tendon rupture) and alternatives were explained to the patient. All questions were sought and answered.  Patient agreed to continue and verbal consent was obtained.   An aspiration and steroid injection was performed on the left knee using 4cc of 1% plain Xyloocaine and 80 mg of depo-medrol. There was minimal bleeding. Hemostasis was intact. A dry dressing was applied. The procedure was well tolerated.       Margaretann LovelessJennifer M Elson Ulbrich, PA-C  [facility]

## 2017-02-12 NOTE — Patient Instructions (Signed)
Biceps Tendon Tendinitis (Distal) Rehab Ask your health care provider which exercises are safe for you. Do exercises exactly as told by your health care provider and adjust them as directed. It is normal to feel mild stretching, pulling, tightness, or discomfort as you do these exercises, but you should stop right away if you feel sudden pain or your pain gets worse.Do not begin these exercises until told by your health care provider. Stretching and range of motion exercises These exercises warm up your muscles and joints and improve the movement and flexibility of your arm. These exercises can also help to relieve pain and stiffness. Exercise A: Supination, active-assisted  1. Stand or sit with your left / right elbow bent to an "L" shape (90 degrees). 2. Rotate your palm up until you cannot rotate it anymore. Then, use your other hand to help rotate your left / right forearm more. 3. Hold this position for __________ seconds. 4. Slowly return to the starting position. Repeat __________ times. Complete this exercise __________ times a day. Exercise B: Pronation, active-assisted   1. Stand or sit with your left / right elbow bent to an "L" shape (90 degrees). 2. Rotate your left / right palm down until you cannot rotate it anymore. Then, use your other hand to help rotate your left / right forearm more. 3. Hold this position for __________ seconds. 4. Slowly return to the starting position. Repeat __________ times. Complete this exercise __________ times a day. Strengthening exercises These exercises build strength and endurance in your arm and shoulder. Endurance is the ability to use your muscles for a long time, even after your muscles get tired. Exercise C: Supination   1. Sit with your left / right forearm supported on a table. Your elbow should be at waist height. 2. Rest your hand over the edge of the table, palm-down. 3. Gently grasp a lightweight hammer near the head. As this  exercise gets easier for you, try holding the hammer farther down the handle. 4. Without moving your elbow, slowly rotate your palm up so it faces the ceiling. 5. Hold this position for__________ seconds. 6. Slowly return to the starting position. Repeat __________ times. Complete this exercise __________ times a day. Exercise D: Pronation   1. Sit with your left / right forearm supported on a table. Your elbow should be at waist height. 2. Rest your hand over the edge of the table, palm-up. 3. Gently grasp a lightweight hammer near the head. As this exercise gets easier for you, try holding the hammer farther down the handle. 4. Without moving your elbow, slowly rotate your palm down. 5. Hold this position for __________ seconds. 6. Slowly return to the starting position. Repeat __________ times. Complete this exercise __________ times a day. Exercise E: Elbow flexion, supinated   1. Sit on a stable chair without armrests, or stand. 2. If directed, hold a __________ weight in your left / right hand, or hold an exercise band with both hands. Your palms should face up toward the ceiling at the starting position. 3. Bend your left / right elbow and move your hand up toward your shoulder. Keep your other arm straight down, in the starting position. 4. Slowly return to the starting position. Repeat __________ times. Complete this exercise __________ times a day. Exercise F: Elbow extension   1. Lie on your back. 2. Hold a __________ weight in your left / right hand. 3. Bend your left / right elbow to an "L" shape (  90 degrees) so your elbow is pointed up to the ceiling and the weight is overhead. 4. Straighten your elbow, raising your hand toward the ceiling. Use your other hand to support your left / right upper arm and to keep it still. 5. Slowly return to the starting position. Repeat __________ times. Complete this exercise __________ times a day. This information is not intended to  replace advice given to you by your health care provider. Make sure you discuss any questions you have with your health care provider. Document Released: 09/18/2005 Document Revised: 05/25/2016 Document Reviewed: 08/27/2015 Elsevier Interactive Patient Education  2017 [employer].

## 2017-04-18 ENCOUNTER — Ambulatory Visit (INDEPENDENT_AMBULATORY_CARE_PROVIDER_SITE_OTHER): Payer: Self-pay | Admitting: Physician Assistant

## 2017-04-18 ENCOUNTER — Encounter: Payer: Self-pay | Admitting: Physician Assistant

## 2017-04-18 VITALS — BP 104/80 | HR 72 | Temp 98.0°F | Resp 16 | Wt 194.0 lb

## 2017-04-18 DIAGNOSIS — R0602 Shortness of breath: Secondary | ICD-10-CM

## 2017-04-18 DIAGNOSIS — F1010 Alcohol abuse, uncomplicated: Secondary | ICD-10-CM

## 2017-04-18 DIAGNOSIS — K219 Gastro-esophageal reflux disease without esophagitis: Secondary | ICD-10-CM

## 2017-04-18 MED ORDER — OMEPRAZOLE 40 MG PO CPDR: 40.0000 mg | DELAYED_RELEASE_CAPSULE | Freq: Every day | ORAL | 3 refills | Status: DC

## 2017-04-18 NOTE — Patient Instructions (Signed)
Heartburn Heartburn is a type of pain or discomfort that can happen in the throat or chest. It is often described as a burning pain. It may also cause a bad taste in the mouth. Heartburn may feel worse when you lie down or bend over, and it is often worse at night. Heartburn may be caused by stomach contents that move back up into the esophagus (reflux). Follow these instructions at home: Take these actions to decrease your discomfort and to help avoid complications. Diet   Follow a diet as recommended by your health care provider. This may involve avoiding foods and drinks such as:  Coffee and tea (with or without caffeine).  Drinks that contain alcohol.  Energy drinks and sports drinks.  Carbonated drinks or sodas.  Chocolate and cocoa.  Peppermint and mint flavorings.  Garlic and onions.  Horseradish.  Spicy and acidic foods, including peppers, chili powder, curry powder, vinegar, hot sauces, and barbecue sauce.  Citrus fruit juices and citrus fruits, such as oranges, lemons, and limes.  Tomato-based foods, such as red sauce, chili, salsa, and pizza with red sauce.  Fried and fatty foods, such as donuts, french fries, potato chips, and high-fat dressings.  High-fat meats, such as hot dogs and fatty cuts of red and white meats, such as rib eye steak, sausage, ham, and bacon.  High-fat dairy items, such as whole milk, butter, and cream cheese.  Eat small, frequent meals instead of large meals.  Avoid drinking large amounts of liquid with your meals.  Avoid eating meals during the 2-3 hours before bedtime.  Avoid lying down right after you eat.  Do not exercise right after you eat. General instructions   Pay attention to any changes in your symptoms.  Take over-the-counter and prescription medicines only as told by your health care provider. Do not take aspirin, ibuprofen, or other NSAIDs unless your health care provider told you to do so.  Do not use any tobacco  products, including cigarettes, chewing tobacco, and e-cigarettes. If you need help quitting, ask your health care provider.  Wear loose-fitting clothing. Do not wear anything tight around your waist that causes pressure on your abdomen.  Raise (elevate) the head of your bed about 6 inches (15 cm).  Try to reduce your stress, such as with yoga or meditation. If you need help reducing stress, ask your health care provider.  If you are overweight, reduce your weight to an amount that is healthy for you. Ask your health care provider for guidance about a safe weight loss goal.  Keep all follow-up visits as told by your health care provider. This is important. Contact a health care provider if:  You have new symptoms.  You have unexplained weight loss.  You have difficulty swallowing, or it hurts to swallow.  You have wheezing or a persistent cough.  Your symptoms do not improve with treatment.  You have frequent heartburn for more than two weeks. Get help right away if:  You have pain in your arms, neck, jaw, teeth, or back.  You feel sweaty, dizzy, or light-headed.  You have chest pain or shortness of breath.  You vomit and your vomit looks like blood or coffee grounds.  Your stool is bloody or black. This information is not intended to replace advice given to you by your health care provider. Make sure you discuss any questions you have with your health care provider. Document Released: 02/04/2009 Document Revised: 02/24/2016 Document Reviewed: 01/13/2015 Elsevier Interactive Patient Education    2017 [employer]   Food Choices for Gastroesophageal Reflux Disease, Adult When you have gastroesophageal reflux disease (GERD), the foods you eat and your eating habits are very important. Choosing the right foods can help ease your discomfort. What guidelines do I need to follow?  Choose fruits, vegetables, whole grains, and low-fat dairy products.  Choose low-fat meat, fish,  and poultry.  Limit fats such as oils, salad dressings, butter, nuts, and avocado.  Keep a food diary. This helps you identify foods that cause symptoms.  Avoid foods that cause symptoms. These may be different for everyone.  Eat small meals often instead of 3 large meals a day.  Eat your meals slowly, in a place where you are relaxed.  Limit fried foods.  Cook foods using methods other than frying.  Avoid drinking alcohol.  Avoid drinking large amounts of liquids with your meals.  Avoid bending over or lying down until 2-3 hours after eating. What foods are not recommended? These are some foods and drinks that may make your symptoms worse: Vegetables  Tomatoes. Tomato juice. Tomato and spaghetti sauce. Chili peppers. Onion and garlic. Horseradish. Fruits  Oranges, grapefruit, and lemon (fruit and juice). Meats  High-fat meats, fish, and poultry. This includes hot dogs, ribs, ham, sausage, salami, and bacon. Dairy  Whole milk and chocolate milk. Sour cream. Cream. Butter. Ice cream. Cream cheese. Drinks  Coffee and tea. Bubbly (carbonated) drinks or energy drinks. Condiments  Hot sauce. Barbecue sauce. Sweets/Desserts  Chocolate and cocoa. Donuts. Peppermint and spearmint. Fats and Oils  High-fat foods. This includes French fries and potato chips. Other  Vinegar. Strong spices. This includes black pepper, white pepper, red pepper, cayenne, curry powder, cloves, ginger, and chili powder. The items listed above may not be a complete list of foods and drinks to avoid. Contact your dietitian for more information.  This information is not intended to replace advice given to you by your health care provider. Make sure you discuss any questions you have with your health care provider. Document Released: 03/19/2012 Document Revised: 02/24/2016 Document Reviewed: 07/23/2013 Elsevier Interactive Patient Education  2017 Elsevier Inc.  

## 2017-04-18 NOTE — Progress Notes (Signed)
Patient: Phillip Santana Male    DOB: April 24, 1971   46 y.o.   MRN: [id number] Visit Date: 04/18/2017  Today's Provider: Margaretann LovelessJennifer M Burnette, PA-C   Chief Complaint  Patient presents with  . Shortness of Breath   Subjective:    HPI Patient is here today C/O shortness of breath x's 3 weeks-one month. Patient reports symptoms are worse at night when he lies flat. Patient denies fever, cough, or runny nose. Patient reports he stopped taking duloxetine and trazodone about a month ago. He does report not eating as well and is drinking on Saturday's only. He does report when he drinks he is drinking [employer] Hard Lemonade and does consume a "large" amount without specifically saying how many. He did say he could finish a case when he drinks, but didn't mention the size (number) that are in the case.     No Known Allergies   Current Outpatient Prescriptions:  .  Cholecalciferol (VITAMIN D PO), Take by mouth., Disp: , Rfl:  .  Lysine 1000 MG TABS, Take 1 tablet by mouth daily., Disp: , Rfl:  .  Multiple Vitamin (MULTIVITAMIN) capsule, Take 1 capsule by mouth daily., Disp: , Rfl:  .  DULoxetine (CYMBALTA) 60 MG capsule, Take 1 capsule (60 mg total) by mouth daily. (Patient not taking: Reported on 04/18/2017), Disp: 30 capsule, Rfl: 5 .  traZODone (DESYREL) 50 MG tablet, Take 0.5-1 tablets (25-50 mg total) by mouth at bedtime as needed for sleep. (Patient not taking: Reported on 04/18/2017), Disp: 60 tablet, Rfl: 3  Review of Systems  Constitutional: Positive for appetite change.  Respiratory: Positive for shortness of breath. Negative for cough, chest tightness and wheezing.   Cardiovascular: Negative for chest pain, palpitations and leg swelling.  Gastrointestinal: Negative for abdominal pain.  Neurological: Negative for dizziness, syncope, weakness, light-headedness, numbness and headaches.    Social History  Substance Use Topics  . Smoking status: Never Smoker  . Smokeless tobacco: Never  Used  . Alcohol use 7.2 oz/week    12 Cans of beer per week     Comment: occasionally   Objective:   BP 104/80 (BP Location: Left Arm, Patient Position: Sitting, Cuff Size: Large)   Pulse 72   Temp 98 F (36.7 C) (Oral)   Resp 16   Wt 194 lb (88 kg)   SpO2 98%   BMI 30.38 kg/m  Vitals:   04/18/17 1437  BP: 104/80  Pulse: 72  Resp: 16  Temp: 98 F (36.7 C)  TempSrc: Oral  SpO2: 98%  Weight: 194 lb (88 kg)     Physical Exam  Constitutional: He appears well-developed and well-nourished. No distress.  HENT:  Head: Normocephalic and atraumatic.  Neck: Normal range of motion. Neck supple. No JVD present. No tracheal deviation present. No thyromegaly present.  Cardiovascular: Normal rate, regular rhythm and normal heart sounds.  Exam reveals no gallop and no friction rub.   No murmur heard. Pulmonary/Chest: Effort normal and breath sounds normal. No respiratory distress. He has no wheezes. He has no rales.  Musculoskeletal: He exhibits no edema.  Lymphadenopathy:    He has no cervical adenopathy.  Skin: He is not diaphoretic.  Vitals reviewed.       Assessment & Plan:     1. Gastroesophageal reflux disease, esophagitis presence not specified Suspect worsening GERD as he has not been taking anything recently. Has used Ranitidine in the past without complete control. Since it only occurs when  he lies flat and is worse the day after drinking. Will treat with omeprazole as below. Spirometry was normal so low suspicion of asthma or obstructive airway. Patient has had sleep studies in the past that were all normal. He does feel overall he is sleeping better. Low suspicion for CAD due to the fact that it never occurs when he is active. He does exercise regularly and has a physical job and never experiences these symptoms with those activities. Possibly also his anxiety/depression. This has been a long standing issue with multiple medication failures. Currently untreated. Patient has  refused counseling. If no improvement with omeprazole may readdress anxiety.  - omeprazole (PRILOSEC) 40 MG capsule; Take 1 capsule (40 mg total) by mouth daily.  Dispense: 30 capsule; Refill: 3  2. SOB (shortness of breath) Normal spirometry. See above medical treatment plan. - Spirometry with Graph - omeprazole (PRILOSEC) 40 MG capsule; Take 1 capsule (40 mg total) by mouth daily.  Dispense: 30 capsule; Refill: 3  3. Alcohol abuse, episodic Discussed alcohol cessation. Patient is going to try to cut back on his own at this time.       Margaretann Loveless, PA-C  [facility]

## 2017-06-08 ENCOUNTER — Encounter: Payer: Self-pay | Admitting: Physician Assistant

## 2017-06-08 ENCOUNTER — Ambulatory Visit (INDEPENDENT_AMBULATORY_CARE_PROVIDER_SITE_OTHER): Payer: Self-pay | Admitting: Physician Assistant

## 2017-06-08 VITALS — BP 100/60 | HR 62 | Temp 97.7°F | Resp 16 | Wt 186.8 lb

## 2017-06-08 DIAGNOSIS — M7581 Other shoulder lesions, right shoulder: Secondary | ICD-10-CM

## 2017-06-08 DIAGNOSIS — F321 Major depressive disorder, single episode, moderate: Secondary | ICD-10-CM

## 2017-06-08 MED ORDER — ETODOLAC 500 MG PO TABS: 500.0000 mg | ORAL_TABLET | Freq: Two times a day (BID) | ORAL | 1 refills | Status: DC

## 2017-06-08 MED ORDER — BUPROPION HCL ER (SR) 150 MG PO TB12: 150.0000 mg | ORAL_TABLET | Freq: Two times a day (BID) | ORAL | 0 refills | Status: DC

## 2017-06-08 MED ORDER — METHYLPREDNISOLONE 4 MG PO TBPK
ORAL_TABLET | ORAL | 0 refills | Status: DC
Start: 2017-06-08 — End: 2017-10-08

## 2017-06-08 NOTE — Patient Instructions (Signed)
10 Relaxation Techniques That Zap Stress Fast By Jeannette Moninger   Listen  Relax. You deserve it, it's good for you, and it takes less time than you think. You don't need a spa weekend or a retreat. Each of these stress-relieving tips can get you from OMG to om in less than 15 minutes. 1. Meditate  A few minutes of practice per day can help ease anxiety. "Research suggests that daily meditation may alter the brain's neural pathways, making you more resilient to stress," says psychologist Robbie Maller Hartman, PhD, a [place] health and wellness coach. It's simple. Sit up straight with both feet on the floor. Close your eyes. Focus your attention on reciting -- out loud or silently -- a positive mantra such as "I feel at peace" or "I love myself." Place one hand on your belly to sync the mantra with your breaths. Let any distracting thoughts float by like clouds. 2. Breathe Deeply  Take a 5-minute break and focus on your breathing. Sit up straight, eyes closed, with a hand on your belly. Slowly inhale through your nose, feeling the breath start in your abdomen and work its way to the top of your head. Reverse the process as you exhale through your mouth.  "Deep breathing counters the effects of stress by slowing the heart rate and lowering blood pressure," psychologist Judith Tutin, PhD, says. She's a certified life coach in [place], [place] 3. Be Present  Slow down.  "Take 5 minutes and focus on only one behavior with awareness," Tutin says. Notice how the air feels on your face when you're walking and how your feet feel hitting the ground. Enjoy the texture and taste of each bite of food. When you spend time in the moment and focus on your senses, you should feel less tense. 4. Reach Out  Your social network is one of your best tools for handling stress. Talk to others -- preferably face to face, or at least on the phone. Share what's going on. You can get a fresh perspective while keeping your  connection strong. 5. Tune In to Your Body  Mentally scan your body to get a sense of how stress affects it each day. Lie on your back, or sit with your feet on the floor. Start at your toes and work your way up to your scalp, noticing how your body feels.  10 Relaxation Techniques That Zap Stress Fast By Jeannette Moninger   Listen  "Simply be aware of places you feel tight or loose without trying to change anything," Tutin says. For 1 to 2 minutes, imagine each deep breath flowing to that body part. Repeat this process as you move your focus up your body, paying close attention to sensations you feel in each body part. 6. Decompress  Place a warm heat wrap around your neck and shoulders for 10 minutes. Close your eyes and relax your face, neck, upper chest, and back muscles. Remove the wrap, and use a tennis ball or foam roller to massage away tension.  "Place the ball between your back and the wall. Lean into the ball, and hold gentle pressure for up to 15 seconds. Then move the ball to another spot, and apply pressure," says Cathy Benninger, a nurse practitioner and assistant professor at [employer] [facility] in [place]. 7. Laugh Out Loud  A good belly laugh doesn't just lighten the load mentally. It lowers cortisol, your body's stress hormone, and boosts brain chemicals called endorphins, which help   your mood. Lighten up by tuning in to your favorite sitcom or video, reading the comics, or chatting with someone who makes you smile. 8. Crank Up the Tunes  Research shows that listening to soothing music can lower blood pressure, heart rate, and anxiety. "Create a playlist of songs or nature sounds (the ocean, a bubbling brook, birds chirping), and allow your mind to focus on the different melodies, instruments, or singers in the piece," Benninger says. You also can blow off steam by rocking out to more upbeat tunes -- or singing at the top of your lungs! 9. Get Moving   You don't have to run in order to get a runner's high. All forms of exercise, including yoga and walking, can ease depression and anxiety by helping the brain release feel-good chemicals and by giving your body a chance to practice dealing with stress. You can go for a quick walk around the block, take the stairs up and down a few flights, or do some stretching exercises like head rolls and shoulder shrugs. 10. Be Grateful  Keep a gratitude journal or several (one by your bed, one in your purse, and one at work) to help you remember all the things that are good in your life.  "Being grateful for your blessings cancels out negative thoughts and worries," says Joni Emmerling, a wellness coach in [place], [place].  Use these journals to savor good experiences like a child's smile, a sunshine-filled day, and good health. Don't forget to celebrate accomplishments like mastering a new task at work or a new hobby. When you start feeling stressed, spend a few minutes looking through your notes to remind yourself what really matters.  

## 2017-06-08 NOTE — Progress Notes (Signed)
Patient: Phillip Santana Male    DOB: [birth year]   46 y.o.   MRN: [id number] Visit Date: 06/08/2017  Today's Provider: Margaretann Loveless, PA-C   Chief Complaint  Patient presents with  . Shoulder Pain   Subjective:    Shoulder Pain   The pain is present in the right shoulder (Pt states is actually his whole body). This is a chronic problem. The problem occurs constantly. The problem has been waxing and waning. The quality of the pain is described as aching. The pain is moderate. Associated symptoms include joint locking, joint swelling, a limited range of motion and stiffness. Pertinent negatives include no numbness or tingling. The symptoms are aggravated by activity. Treatments tried: Percocet and IBU. His mother gave him Meloxicam but it didn't work. The treatment provided mild relief.   He is also having worsening depression again. He reports that it stems from him not liking his current job. He works a third shift and reports that since he started this job in May he has noticed his mood worsening. He also has been holding on to anger from when he was fired from his previous position without just cause. He wishes some days he could just quit. He now finds himself staying in bed all day once he gets home from work. He does not want to go out and be around other people. He also quit working out. He reports he has only been to the gym twice since starting this new job. Now he is having more aches and pains and feels weak. He also reports having no libido. He also has He has been on multiple medications for depression in the past. He recently restarted his wellbutrin  BID. He started 2 weeks ago and feels he is already improving some.     No Known Allergies   Current Outpatient Prescriptions:  .  Cholecalciferol (VITAMIN D PO), Take by mouth., Disp: , Rfl:  .  Lysine 1000 MG TABS, Take 1 tablet by mouth daily., Disp: , Rfl:  .  Multiple Vitamin (MULTIVITAMIN) capsule, Take 1  capsule by mouth daily., Disp: , Rfl:  .  omeprazole (PRILOSEC) 40 MG capsule, Take 1 capsule (40 mg total) by mouth daily. (Patient not taking: Reported on 06/08/2017), Disp: 30 capsule, Rfl: 3  Review of Systems  Constitutional: Positive for fatigue.  Respiratory: Negative for cough, chest tightness and shortness of breath.   Cardiovascular: Negative for chest pain, palpitations and leg swelling.  Musculoskeletal: Positive for arthralgias, back pain, myalgias and stiffness. Negative for joint swelling, neck pain and neck stiffness.  Skin: Negative.   Neurological: Negative for tingling and numbness.  Psychiatric/Behavioral: Positive for agitation, decreased concentration, dysphoric mood and sleep disturbance. Negative for self-injury and suicidal ideas. The patient is not nervous/anxious.     Social History  Substance Use Topics  . Smoking status: Never Smoker  . Smokeless tobacco: Never Used  . Alcohol use 7.2 oz/week    12 Cans of beer per week     Comment: occasionally   Objective:   BP 100/60 (BP Location: Left Arm, Patient Position: Sitting, Cuff Size: Large)   Pulse 62   Temp 97.7 F (36.5 C) (Oral)   Resp 16   Wt 186 lb 12.8 oz (84.7 kg)   BMI 29.26 kg/m     Physical Exam  Constitutional: He appears well-developed and well-nourished. No distress.  HENT:  Head: Normocephalic and atraumatic.  Neck: Normal range of motion.  Neck supple. No JVD present. No tracheal deviation present. No thyromegaly present.  Cardiovascular: Normal rate, regular rhythm and normal heart sounds.  Exam reveals no gallop and no friction rub.   No murmur heard. Pulmonary/Chest: Effort normal and breath sounds normal. No respiratory distress. He has no wheezes. He has no rales.  Musculoskeletal:       Right shoulder: He exhibits decreased range of motion, tenderness and pain. He exhibits no bony tenderness, no swelling, no effusion, no crepitus, no spasm, normal pulse and normal strength.    Lymphadenopathy:    He has no cervical adenopathy.  Skin: He is not diaphoretic.  Psychiatric: His speech is normal and behavior is normal. Judgment and thought content normal. Cognition and memory are normal. He exhibits a depressed mood.  Vitals reviewed.     Assessment & Plan:     1. Depression, major, single episode, moderate (HCC) Worsening again. Wellbutrin restarted. Patient is to call in 4-6 weeks and let me know if this is working well. If still not to goal may add another agent (SSRI/SNRI) to wellbutrin to see if we can optimize symptom control.  - buPROPion (WELLBUTRIN SR) 150 MG 12 hr tablet; Take 1 tablet (150 mg total) by mouth 2 (two) times daily.  Dispense: 60 tablet; Refill: 0  2. 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 Suspect rotator cuff tendinitis along with possible shoulder arthritis. Will give medrol dose pak as below. He is to the start etodolac following completion of the medrol dose pak. He is to call if symptoms do not improve or if they worsen. He does have an orthopedic, Dr. Darrelyn HillockGioffre, but has been unable to see him due to not having insurance currently.  - etodolac (LODINE) 500 MG tablet; Take 1 tablet (500 mg total) by mouth 2 (two) times daily.  Dispense: 60 tablet; Refill: 1 - methylPREDNISolone (MEDROL DOSEPAK) 4 MG TBPK tablet; Take as directed on package instructions  Dispense: 21 tablet; Refill: 0      Margaretann LovelessJennifer M Kimmi Acocella, PA-C  [facility]

## 2017-10-08 ENCOUNTER — Ambulatory Visit: Payer: BLUE CROSS/BLUE SHIELD | Admitting: Physician Assistant

## 2017-10-08 ENCOUNTER — Encounter: Payer: Self-pay | Admitting: Physician Assistant

## 2017-10-08 VITALS — BP 120/80 | HR 60 | Temp 97.9°F | Resp 16 | Ht 67.0 in | Wt 192.0 lb

## 2017-10-08 DIAGNOSIS — B353 Tinea pedis: Secondary | ICD-10-CM

## 2017-10-08 DIAGNOSIS — H6123 Impacted cerumen, bilateral: Secondary | ICD-10-CM | POA: Diagnosis not present

## 2017-10-08 DIAGNOSIS — F321 Major depressive disorder, single episode, moderate: Secondary | ICD-10-CM

## 2017-10-08 MED ORDER — CLOTRIMAZOLE-BETAMETHASONE 1-0.05 % EX CREA: 1.0000 "application " | TOPICAL_CREAM | Freq: Two times a day (BID) | CUTANEOUS | 0 refills | Status: DC

## 2017-10-08 MED ORDER — DESVENLAFAXINE SUCCINATE ER 50 MG PO TB24: 50.0000 mg | ORAL_TABLET | Freq: Every day | ORAL | 3 refills | Status: DC

## 2017-10-08 NOTE — Progress Notes (Signed)
Patient: Phillip Santana Male    DOB: 09-05-1971   47 y.o.   MRN: [id number] Visit Date: 10/08/2017  Today's Provider: Margaretann Loveless, PA-C   Chief Complaint  Patient presents with  . Follow-up   Subjective:    HPI   Follow up for Depression  The patient was last seen for this 4 months ago. Changes made at last visit include restart Wellbutrin 150 mg daily.   He reports good compliance with treatment. He feels that condition is Worse. He is not having side effects.  He has tried and failed multiple treatments including sertraline, citalopram, Escitalopram, paroxetine, venlafaxine and duloxetine.  ------------------------------------------------------------------------------------ Patient c/o rash on right big toe c/w athletes foot. Patient reports cerumen impact and wants ears flushed out.     No Known Allergies   Current Outpatient Medications:  .  buPROPion (WELLBUTRIN SR) 150 MG 12 hr tablet, Take 1 tablet (150 mg total) by mouth 2 (two) times daily., Disp: 60 tablet, Rfl: 0 .  Lysine 1000 MG TABS, Take 1 tablet by mouth daily., Disp: , Rfl:  .  Multiple Vitamin (MULTIVITAMIN) capsule, Take 1 capsule by mouth daily., Disp: , Rfl:   Review of Systems  Constitutional: Negative.   HENT: Negative.   Respiratory: Negative.   Cardiovascular: Negative.   Gastrointestinal: Negative.   Musculoskeletal: Positive for arthralgias (knee).  Skin: Positive for rash.  Neurological: Negative.   Psychiatric/Behavioral: Positive for agitation, decreased concentration, dysphoric mood and sleep disturbance (hypersomnia). The patient is nervous/anxious.     Social History   Tobacco Use  . Smoking status: Never Smoker  . Smokeless tobacco: Never Used  Substance Use Topics  . Alcohol use: Yes    Alcohol/week: 7.2 oz    Types: 12 Cans of beer per week    Comment: occasionally   Objective:   BP 120/80 (BP Location: Left Arm, Patient Position: Sitting, Cuff Size: Large)    Pulse 60   Temp 97.9 F (36.6 C) (Oral)   Resp 16   Ht 5\' 7"  (1.702 m)   Wt 192 lb (87.1 kg)   SpO2 98%   BMI 30.07 kg/m  Vitals:   10/08/17 1451  BP: 120/80  Pulse: 60  Resp: 16  Temp: 97.9 F (36.6 C)  TempSrc: Oral  SpO2: 98%  Weight: 192 lb (87.1 kg)  Height: 5\' 7"  (1.702 m)     Physical Exam  Constitutional: He appears well-developed and well-nourished. No distress.  HENT:  Head: Normocephalic and atraumatic.  Cerumen impaction noted bilaterally. Ear lavage successful. TM visualized and normal.  Neck: Normal range of motion. Neck supple. No tracheal deviation present. No thyromegaly present.  Cardiovascular: Normal rate, regular rhythm and normal heart sounds. Exam reveals no gallop and no friction rub.  No murmur heard. Pulmonary/Chest: Effort normal and breath sounds normal. No respiratory distress. He has no wheezes. He has no rales.  Lymphadenopathy:    He has no cervical adenopathy.  Skin: He is not diaphoretic.  Psychiatric: His speech is normal and behavior is normal. Judgment and thought content normal. Cognition and memory are normal. He exhibits a depressed mood. He expresses no homicidal and no suicidal ideation.  Vitals reviewed.      Assessment & Plan:     1. Depression, major, single episode, moderate (HCC) Patient has used samples of pristiq in the past and had a good response but was unable to afford due to high deductible plan. Patient now has new  insurance and is willing to try this again. I will send in Rx as below and see him back in 4 weeks. He is to call if unable to afford or if he has adverse effects.  - desvenlafaxine (PRISTIQ) 50 MG 24 hr tablet; Take 1 tablet (50 mg total) by mouth daily.  Dispense: 30 tablet; Refill: 3  2. Tinea pedis of right foot Lotrisone cream sent in as below. Call if no improvement.  - clotrimazole-betamethasone (LOTRISONE) cream; Apply 1 application topically 2 (two) times daily.  Dispense: 30 g; Refill:  0  3. Bilateral impacted cerumen Ear lavage performed bilaterally and successfully cleaned.  - Ear Lavage       Margaretann LovelessJennifer M Arvil Utz, PA-C  [facility]

## 2017-10-08 NOTE — Patient Instructions (Signed)
Desvenlafaxine extended-release tablets What is this medicine? DESVENLAFAXINE (des VEN la FAX een) is used to treat depression. This medicine may be used for other purposes; ask your health care provider or pharmacist if you have questions. COMMON BRAND NAME(S): Khedezla, Pristiq What should I tell my health care provider before I take this medicine? They need to know if you have any of these conditions: -glaucoma -high blood pressure -kidney disease -liver disease -mania or bipolar disorder -suicidal thoughts or a previous suicide attempt -an unusual reaction to desvenlafaxine, venlafaxine, other medicines, foods, dyes, or preservatives -pregnant or trying to get pregnant -breast-feeding How should I use this medicine? Take this medicine by mouth with a drink of water. Do not crush, cut or chew. Follow the directions on the prescription label. Take your doses at regular intervals. Do not take your medicine more often than directed. Do not stop taking this medicine suddenly except upon the advice of your doctor. Stopping this medicine too quickly may cause serious side effects or your condition may worsen. Contact your pediatrician or health care professional regarding the use of this medicine in children. Special care may be needed. A special MedGuide will be given to you by the pharmacist with each prescription and refill. Be sure to read this information carefully each time. Overdosage: If you think you have taken too much of this medicine contact a poison control center or emergency room at once. NOTE: This medicine is only for you. Do not share this medicine with others. What if I miss a dose? If you miss a dose, take it as soon as you can. If it is almost time for your next dose, take only that dose. Do not take double or extra doses. What may interact with this medicine? Do not take this medicine with any of the following medications: -duloxetine -levomilnacipran -linezolid -MAOIs  like Carbex, Eldepryl, Marplan, Nardil, and Parnate -methylene blue (injected into a vein) -milnacipran -venlafaxine This medicine may also interact with the following medications: -alcohol -amphetamines -aspirin and aspirin-like medicines -certain migraine headache medicines (almotriptan, eletriptan, frovatriptan, naratriptan, rizatriptan, sumatriptan, zolmitriptan) -dexfenfluramine or fenfluramine -furazolidone -isoniazid -lithium -medicines for heart rhythm or blood pressure -medicines that treat or prevent blood clots like warfarin, enoxaparin, and dalteparin -methylphenidate -metoclopramide -NSAIDS, medicines for pain and inflammation, like ibuprofen or naproxen -pentazocine -phentermine -procarbazine -protriptyline -rasagiline -sibutramine -St. John's wort, Hypericum perforatum -tramadol -tryptophan -zolpidem This list may not describe all possible interactions. Give your health care provider a list of all the medicines, herbs, non-prescription drugs, or dietary supplements you use. Also tell them if you smoke, drink alcohol, or use illegal drugs. Some items may interact with your medicine. What should I watch for while using this medicine? Tell your doctor if your symptoms do not get better or if they get worse. Visit your doctor or health care professional for regular checks on your progress. Because it may take several weeks to see the full effects of this medicine, it is important to continue your treatment as prescribed by your doctor. Patients and their families should watch out for new or worsening thoughts of suicide or depression. Also watch out for sudden changes in feelings such as feeling anxious, agitated, panicky, irritable, hostile, aggressive, impulsive, severely restless, overly excited and hyperactive, or not being able to sleep. If this happens, especially at the beginning of treatment or after a change in dose, call your health care professional. This  medicine can cause an increase in blood pressure. Check with your doctor for   instructions on monitoring your blood pressure while taking this medicine. You may get drowsy or dizzy. Do not drive, use machinery, or do anything that needs mental alertness until you know how this medicine affects you. Do not stand or sit up quickly, especially if you are an older patient. This reduces the risk of dizzy or fainting spells. Alcohol may interfere with the effect of this medicine. Avoid alcoholic drinks. Your mouth may get dry. Chewing sugarless gum, sucking hard candy and drinking plenty of water will help. Contact your doctor if the problem does not go away or is severe. What side effects may I notice from receiving this medicine? Side effects that you should report to your doctor or health care professional as soon as possible: -allergic reactions like skin rash, itching or hives, swelling of the face, lips, or tongue -anxious -breathing problems -confusion -changes in vision -chest pain -confusion -elevated mood, decreased need for sleep, racing thoughts, impulsive behavior -eye pain -fast, irregular heartbeat -feeling faint or lightheaded, falls -feeling agitated, angry, or irritable -hallucination, loss of contact with reality -high blood pressure -loss of balance or coordination -palpitations -redness, blistering, peeling or loosening of the skin, including inside the mouth -restlessness, pacing, inability to keep still -seizures -stiff muscles -suicidal thoughts or other mood changes -trouble passing urine or change in the amount of urine -trouble sleeping -unusual bleeding or bruising -unusually weak or tired -vomiting Side effects that usually do not require medical attention (report to your doctor or health care professional if they continue or are bothersome): -change in sex drive or performance -change in appetite or weight -constipation -dizziness -dry  mouth -headache -increased sweating -nausea -tired This list may not describe all possible side effects. Call your doctor for medical advice about side effects. You may report side effects to FDA at 1-800-FDA-1088. Where should I keep my medicine? Keep out of the reach of children. Store at room temperature between 15 and 30 degrees C (59 and 86 degrees F). Throw away any unused medicine after the expiration date. NOTE: This sheet is a summary. It may not cover all possible information. If you have questions about this medicine, talk to your doctor, pharmacist, or health care provider.  2018 Elsevier/Gold Standard (2016-02-17 17:35:48)  

## 2017-10-19 DIAGNOSIS — M531 Cervicobrachial syndrome: Secondary | ICD-10-CM | POA: Diagnosis not present

## 2017-10-19 DIAGNOSIS — M9903 Segmental and somatic dysfunction of lumbar region: Secondary | ICD-10-CM | POA: Diagnosis not present

## 2017-10-19 DIAGNOSIS — M9901 Segmental and somatic dysfunction of cervical region: Secondary | ICD-10-CM | POA: Diagnosis not present

## 2017-10-19 DIAGNOSIS — M5411 Radiculopathy, occipito-atlanto-axial region: Secondary | ICD-10-CM | POA: Diagnosis not present

## 2017-10-25 DIAGNOSIS — M1712 Unilateral primary osteoarthritis, left knee: Secondary | ICD-10-CM | POA: Diagnosis not present

## 2017-11-05 ENCOUNTER — Encounter: Payer: Self-pay | Admitting: Physician Assistant

## 2017-11-05 ENCOUNTER — Ambulatory Visit: Payer: BLUE CROSS/BLUE SHIELD | Admitting: Physician Assistant

## 2017-11-05 VITALS — BP 120/80 | HR 84 | Temp 98.3°F | Resp 16 | Ht 67.0 in | Wt 190.0 lb

## 2017-11-05 DIAGNOSIS — F321 Major depressive disorder, single episode, moderate: Secondary | ICD-10-CM | POA: Diagnosis not present

## 2017-11-05 DIAGNOSIS — M1712 Unilateral primary osteoarthritis, left knee: Secondary | ICD-10-CM | POA: Diagnosis not present

## 2017-11-05 MED ORDER — TRAMADOL HCL 50 MG PO TABS
50.0000 mg | ORAL_TABLET | Freq: Three times a day (TID) | ORAL | 0 refills | Status: DC | PRN
Start: 2017-11-05 — End: 2018-04-11

## 2017-11-05 MED ORDER — DESVENLAFAXINE SUCCINATE ER 100 MG PO TB24: 100.0000 mg | ORAL_TABLET | Freq: Every day | ORAL | 5 refills | Status: DC

## 2017-11-05 NOTE — Patient Instructions (Signed)
Tumeric 500-1000mg  for inflammation

## 2017-11-05 NOTE — Progress Notes (Signed)
Patient: Phillip Santana Male    DOB: 1971-02-28   47 y.o.   MRN: [id number] Visit Date: 11/05/2017  Today's Provider: Margaretann Loveless, PA-C   Chief Complaint  Patient presents with  . Depression   Subjective:    HPI  Depression, Follow-up  He  was last seen for this 4 months ago. Changes made at last visit include start Pristiq.   He reports good compliance with treatment. He is not having side effects.   He reports good tolerance of treatment. Current symptoms include: depressed mood and fatigue He feels he is Unchanged since last visit. ------------------------------------------------------------------------     No Known Allergies   Current Outpatient Medications:  .  clotrimazole-betamethasone (LOTRISONE) cream, Apply 1 application topically 2 (two) times daily., Disp: 30 g, Rfl: 0 .  desvenlafaxine (PRISTIQ) 50 MG 24 hr tablet, Take 1 tablet (50 mg total) by mouth daily., Disp: 30 tablet, Rfl: 3 .  Lysine 1000 MG TABS, Take 1 tablet by mouth daily., Disp: , Rfl:  .  Multiple Vitamin (MULTIVITAMIN) capsule, Take 1 capsule by mouth daily., Disp: , Rfl:  .  buPROPion (WELLBUTRIN SR) 150 MG 12 hr tablet, Take 1 tablet (150 mg total) by mouth 2 (two) times daily. (Patient not taking: Reported on 11/05/2017), Disp: 60 tablet, Rfl: 0  Review of Systems  Constitutional: Positive for fatigue.  Respiratory: Negative.   Cardiovascular: Negative.   Gastrointestinal: Negative.   Psychiatric/Behavioral: Positive for agitation and dysphoric mood.    Social History   Tobacco Use  . Smoking status: Never Smoker  . Smokeless tobacco: Never Used  Substance Use Topics  . Alcohol use: Yes    Alcohol/week: 7.2 oz    Types: 12 Cans of beer per week    Comment: occasionally   Objective:   BP 120/80 (BP Location: Left Arm, Patient Position: Sitting, Cuff Size: Large)   Pulse 84   Temp 98.3 F (36.8 C) (Oral)   Resp 16   Ht 5\' 7"  (1.702 m)   Wt 190 lb (86.2 kg)    SpO2 98%   BMI 29.76 kg/m  Vitals:   11/05/17 1616  BP: 120/80  Pulse: 84  Resp: 16  Temp: 98.3 F (36.8 C)  TempSrc: Oral  SpO2: 98%  Weight: 190 lb (86.2 kg)  Height: 5\' 7"  (1.702 m)     Physical Exam  Constitutional: He appears well-developed and well-nourished. No distress.  HENT:  Head: Normocephalic and atraumatic.  Neck: Normal range of motion. Neck supple. No JVD present. No tracheal deviation present. No thyromegaly present.  Cardiovascular: Normal rate, regular rhythm and normal heart sounds. Exam reveals no gallop and no friction rub.  No murmur heard. Pulmonary/Chest: Effort normal and breath sounds normal. No respiratory distress. He has no wheezes. He has no rales.  Lymphadenopathy:    He has no cervical adenopathy.  Skin: He is not diaphoretic.  Psychiatric: He has a normal mood and affect. His behavior is normal. Judgment and thought content normal.  Vitals reviewed.       Assessment & Plan:     1. Depression, major, single episode, moderate (HCC) Increase Pristiq to 100mg  as below. He is to let me know in 4 weeks how he is doing with the increase. May consider adding wellbutrin 150mg  in the a.m.  - desvenlafaxine (PRISTIQ) 100 MG 24 hr tablet; Take 1 tablet (100 mg total) by mouth daily.  Dispense: 30 tablet; Refill: 5  2. Primary  osteoarthritis of left knee Worsening. Will try tramadol as below for knee pain so he does not use percocet. Again he will let me know if this is successful.  - traMADol (ULTRAM) 50 MG tablet; Take 1 tablet (50 mg total) by mouth every 8 (eight) hours as needed.  Dispense: 90 tablet; Refill: 0       Margaretann LovelessJennifer M Burnette, PA-C  [facility]

## 2018-03-04 ENCOUNTER — Encounter: Payer: Self-pay | Admitting: Physician Assistant

## 2018-03-04 ENCOUNTER — Ambulatory Visit: Payer: BLUE CROSS/BLUE SHIELD | Admitting: Physician Assistant

## 2018-03-04 VITALS — BP 120/60 | HR 72 | Temp 97.9°F | Resp 16 | Wt 193.0 lb

## 2018-03-04 DIAGNOSIS — L247 Irritant contact dermatitis due to plants, except food: Secondary | ICD-10-CM

## 2018-03-04 MED ORDER — PREDNISONE 10 MG (21) PO TBPK: ORAL_TABLET | ORAL | 0 refills | Status: DC

## 2018-03-04 NOTE — Progress Notes (Signed)
       Patient: Phillip Santana Male    DOB: [birth year]   47 y.o.   MRN: [id number] Visit Date: 03/04/2018  Today's Provider: Margaretann LovelessJennifer M Burnette, PA-C   Chief Complaint  Patient presents with  . Rash   Subjective:    Rash  This is a new problem. The current episode started in the past 7 days. The problem has been gradually worsening since onset. The affected locations include the neck, right arm and left arm. The rash is characterized by redness. He was exposed to plant contact. Pertinent negatives include no fatigue, fever, joint pain or shortness of breath. Past treatments include anti-itch cream and topical steroids (Kenalog 0.1% and Lotrisone). The treatment provided no relief.      No Known Allergies   Current Outpatient Medications:  .  clotrimazole-betamethasone (LOTRISONE) cream, Apply 1 application topically 2 (two) times daily., Disp: 30 g, Rfl: 0 .  Lysine 1000 MG TABS, Take 1 tablet by mouth daily., Disp: , Rfl:  .  traMADol (ULTRAM) 50 MG tablet, Take 1 tablet (50 mg total) by mouth every 8 (eight) hours as needed., Disp: 90 tablet, Rfl: 0 .  triamcinolone cream (KENALOG) 0.1 %, Apply 1 application topically 2 (two) times daily., Disp: , Rfl:  .  desvenlafaxine (PRISTIQ) 100 MG 24 hr tablet, Take 1 tablet (100 mg total) by mouth daily. (Patient not taking: Reported on 03/04/2018), Disp: 30 tablet, Rfl: 5 .  Multiple Vitamin (MULTIVITAMIN) capsule, Take 1 capsule by mouth daily., Disp: , Rfl:   Review of Systems  Constitutional: Negative for fatigue and fever.  Respiratory: Negative for shortness of breath.   Musculoskeletal: Negative for joint pain.  Skin: Positive for rash.    Social History   Tobacco Use  . Smoking status: Never Smoker  . Smokeless tobacco: Never Used  Substance Use Topics  . Alcohol use: Yes    Alcohol/week: 7.2 oz    Types: 12 Cans of beer per week    Comment: occasionally   Objective:   BP 120/60 (BP Location: Left Arm, Patient Position:  Sitting, Cuff Size: Normal)   Pulse 72   Temp 97.9 F (36.6 C) (Oral)   Resp 16   Wt 193 lb (87.5 kg)   SpO2 98%   BMI 30.23 kg/m    Physical Exam  Constitutional: He appears well-developed and well-nourished. No distress.  HENT:  Head: Normocephalic and atraumatic.  Neck: Normal range of motion. Neck supple.  Cardiovascular: Normal rate, regular rhythm and normal heart sounds. Exam reveals no gallop and no friction rub.  No murmur heard. Pulmonary/Chest: Effort normal and breath sounds normal. No respiratory distress. He has no wheezes. He has no rales.  Skin: Rash (diffusely located on arms, trunk and legs) noted. Rash is papular and vesicular. He is not diaphoretic.  Vitals reviewed.      Assessment & Plan:     1. Irritant contact dermatitis due to plants, except food Poison oak. Worsening. Will give prednisone as below. Call if no improvements. - predniSONE (STERAPRED UNI-PAK 21 TAB) 10 MG (21) TBPK tablet; 6 day taper; take as directed on package instructions  Dispense: 21 tablet; Refill: 0       Margaretann LovelessJennifer M Burnette, PA-C  [facility]

## 2018-03-04 NOTE — Patient Instructions (Signed)
Poison Oak Dermatitis Poison oak dermatitis is redness and soreness (inflammation) of the skin. It is caused by a chemical that is on the leaves of the poison oak plant. You may also have itching, a rash, and blisters. Symptoms often clear up in 1-2 weeks. You may get this condition by touching a poison oak plant. You can also get it by touching something that has the chemical on it. This may include animals or objects that have come in contact with the plant. Follow these instructions at home: General instructions  Take or apply over-the-counter and prescription medicines only as told by your doctor.  If you touch poison oak, wash your skin with soap and cold water right away.  Use hydrocortisone creams or calamine lotion as needed to help with itching.  Take oatmeal baths as needed. Use colloidal oatmeal. You can get this at a pharmacy or grocery store. Follow the instructions on the package.  Do not scratch or rub your skin.  While you have the rash, wash your clothes right after you wear them. Prevention   Know what poison oak looks like so you can avoid it. This plant has three leaves with flowering branches on a single stem. The leaves are fuzzy. They have a toothlike edge.  If you have touched poison oak, wash with soap and water right away. Be sure to wash under your fingernails.  When hiking or camping, wear long pants, a long-sleeved shirt, tall socks, and hiking boots. You can also use a lotion on your skin that helps to prevent contact with the chemical on the plant.  If you think that your clothes or outdoor gear came in contact with poison oak, rinse them off with a garden hose before you bring them inside your house. Contact a doctor if:  You have open sores in the rash area.  You have more redness, swelling, or pain in the affected area.  You have redness that spreads beyond the rash area.  You have fluid, blood, or pus coming from the affected area.  You have a  fever.  You have a rash over a large area of your body.  You have a rash on your eyes, mouth, or genitals.  Your rash does not improve after a few days. Get help right away if:  Your face swells or your eyes swell shut.  You have trouble breathing.  You have trouble swallowing. This information is not intended to replace advice given to you by your health care provider. Make sure you discuss any questions you have with your health care provider. Document Released: 10/21/2010 Document Revised: 02/24/2016 Document Reviewed: 02/24/2015 Elsevier Interactive Patient Education  2018 Elsevier Inc.  

## 2018-04-11 ENCOUNTER — Encounter: Payer: Self-pay | Admitting: Physician Assistant

## 2018-04-11 ENCOUNTER — Ambulatory Visit: Payer: BLUE CROSS/BLUE SHIELD | Admitting: Physician Assistant

## 2018-04-11 ENCOUNTER — Ambulatory Visit: Payer: Self-pay | Admitting: Physician Assistant

## 2018-04-11 VITALS — BP 120/80 | HR 78 | Temp 98.0°F | Resp 16 | Wt 190.8 lb

## 2018-04-11 DIAGNOSIS — F411 Generalized anxiety disorder: Secondary | ICD-10-CM | POA: Diagnosis not present

## 2018-04-11 DIAGNOSIS — F321 Major depressive disorder, single episode, moderate: Secondary | ICD-10-CM

## 2018-04-11 MED ORDER — BUSPIRONE HCL 5 MG PO TABS: 5.0000 mg | ORAL_TABLET | Freq: Three times a day (TID) | ORAL | 1 refills | Status: DC

## 2018-04-11 MED ORDER — VORTIOXETINE HBR 10 MG PO TABS: 10.0000 mg | ORAL_TABLET | Freq: Every day | ORAL | 1 refills | Status: DC

## 2018-04-11 NOTE — Patient Instructions (Signed)
Vortioxetine oral tablet What is this medicine? Vortioxetine (vor tee OX e teen) is used to treat depression. This medicine may be used for other purposes; ask your health care provider or pharmacist if you have questions. COMMON BRAND NAME(S): BRINTELLIX, Trintellix What should I tell my health care provider before I take this medicine? They need to know if you have any of these conditions: -bipolar disorder or a family history of bipolar disorder -bleeding disorders -drink alcohol -glaucoma -liver disease -low levels of sodium in the blood -seizures -suicidal thoughts, plans, or attempt; a previous suicide attempt by you or a family member -take medicines that treat or prevent blood clots -an unusual or allergic reaction to vortioxetine, other medicines, foods, dyes, or preservatives -pregnant or trying to get pregnant -breast-feeding How should I use this medicine? Take this medicine by mouth with a glass of water. Follow the directions on the prescription label. You can take it with or without food. If it upsets your stomach, take it with food. Take your medicine at regular intervals. Do not take it more often than directed. Do not stop taking this medicine suddenly except upon the advice of your doctor. Stopping this medicine too quickly may cause serious side effects or your condition may worsen. A special MedGuide will be given to you by the pharmacist with each prescription and refill. Be sure to read this information carefully each time. Talk to your pediatrician regarding the use of this medicine in children. Special care may be needed. Overdosage: If you think you have taken too much of this medicine contact a poison control center or emergency room at once. NOTE: This medicine is only for you. Do not share this medicine with others. What if I miss a dose? If you miss a dose, take it as soon as you can. If it is almost time for your next dose, take only that dose. Do not take  double or extra doses. What may interact with this medicine? Do not take this medicine with any of the following medications: -linezolid -MAOIs like Carbex, Eldepryl, Marplan, Nardil, and Parnate -methylene blue (injected into a vein) This medicine may also interact with the following medications: -alcohol -aspirin and aspirin-like medicines -carbamazepine -certain medicines for depression, anxiety, or psychotic disturbances -certain medicines for migraine headache like almotriptan, eletriptan, frovatriptan, naratriptan, rizatriptan, sumatriptan, zolmitriptan -diuretics -fentanyl -furazolidone -isoniazid -medicines that treat or prevent blood clots like warfarin, enoxaparin, and dalteparin -NSAIDs, medicines for pain and inflammation, like ibuprofen or naproxen -phenytoin -procarbazine -quinidine -rasagiline -rifampin -supplements like St. John's wort, kava kava, valerian -tramadol -tryptophan This list may not describe all possible interactions. Give your health care provider a list of all the medicines, herbs, non-prescription drugs, or dietary supplements you use. Also tell them if you smoke, drink alcohol, or use illegal drugs. Some items may interact with your medicine. What should I watch for while using this medicine? Tell your doctor if your symptoms do not get better or if they get worse. Visit your doctor or health care professional for regular checks on your progress. Because it may take several weeks to see the full effects of this medicine, it is important to continue your treatment as prescribed by your doctor. Patients and their families should watch out for new or worsening thoughts of suicide or depression. Also watch out for sudden changes in feelings such as feeling anxious, agitated, panicky, irritable, hostile, aggressive, impulsive, severely restless, overly excited and hyperactive, or not being able to sleep. If this happens,   especially at the beginning of  treatment or after a change in dose, call your health care professional. You may get drowsy or dizzy. Do not drive, use machinery, or do anything that needs mental alertness until you know how this medicine affects you. Do not stand or sit up quickly, especially if you are an older patient. This reduces the risk of dizzy or fainting spells. Alcohol may interfere with the effect of this medicine. Avoid alcoholic drinks. Your mouth may get dry. Chewing sugarless gum or sucking hard candy, and drinking plenty of water may help. Contact your doctor if the problem does not go away or is severe. What side effects may I notice from receiving this medicine? Side effects that you should report to your doctor or health care professional as soon as possible: -allergic reactions like skin rash, itching or hives, swelling of the face, lips, or tongue -anxious -black, tarry stools -changes in vision -confusion -elevated mood, decreased need for sleep, racing thoughts, impulsive behavior -eye pain -fast, irregular heartbeat -feeling faint or lightheaded, falls -feeling agitated, angry, or irritable -hallucination, loss of contact with reality -loss of balance or coordination -loss of memory -painful or prolonged erections -restlessness, pacing, inability to keep still -seizures -stiff muscles -suicidal thoughts or other mood changes -trouble sleeping -unusual bleeding or bruising -unusually weak or tired -vomiting Side effects that usually do not require medical attention (report to your doctor or health care professional if they continue or are bothersome): -change in appetite or weight -change in sex drive or performance -constipation -dizziness -dry mouth -nausea This list may not describe all possible side effects. Call your doctor for medical advice about side effects. You may report side effects to FDA at 1-800-FDA-1088. Where should I keep my medicine? Keep out of the reach of  children. Store at room temperature between 15 and 30 degrees C (59 and 86 degrees F). Throw away any unused medicine after the expiration date. NOTE: This sheet is a summary. It may not cover all possible information. If you have questions about this medicine, talk to your doctor, pharmacist, or health care provider.  2018 Elsevier/Gold Standard (2016-02-17 16:45:13)  

## 2018-04-11 NOTE — Progress Notes (Signed)
Patient: Phillip Santana Male    DOB: [birth year]   47 y.o.   MRN: [id number] Visit Date: 04/11/2018  Today's Provider: Margaretann Loveless, PA-C   Chief Complaint  Patient presents with  . Depression   Subjective:    HPI  Patient here today with c/o Depression. Depression: Patient complains of depression. He complains of depressed mood, difficulty concentrating, feelings of worthlessness/guilt, hopelessness, recurrent thoughts of death, suicidal thoughts without plan and just wants to seat around. Sleeping a lot. Reports that his patience is not really good right know. He denies current suicidal and homicidal plan or intent.     No Known Allergies   Current Outpatient Medications:  .  traMADol (ULTRAM) 50 MG tablet, Take 1 tablet (50 mg total) by mouth every 8 (eight) hours as needed., Disp: 90 tablet, Rfl: 0 .  clotrimazole-betamethasone (LOTRISONE) cream, Apply 1 application topically 2 (two) times daily. (Patient not taking: Reported on 04/11/2018), Disp: 30 g, Rfl: 0 .  Lysine 1000 MG TABS, Take 1 tablet by mouth daily., Disp: , Rfl:  .  Multiple Vitamin (MULTIVITAMIN) capsule, Take 1 capsule by mouth daily., Disp: , Rfl:  .  predniSONE (STERAPRED UNI-PAK 21 TAB) 10 MG (21) TBPK tablet, 6 day taper; take as directed on package instructions (Patient not taking: Reported on 04/11/2018), Disp: 21 tablet, Rfl: 0 .  triamcinolone cream (KENALOG) 0.1 %, Apply 1 application topically 2 (two) times daily., Disp: , Rfl:   Review of Systems  Constitutional: Positive for fatigue.  Respiratory: Negative for cough, chest tightness, shortness of breath and wheezing.   Cardiovascular: Negative for chest pain, palpitations and leg swelling.  Gastrointestinal: Negative for abdominal pain.  Neurological: Negative for dizziness and headaches.  Psychiatric/Behavioral: Positive for agitation, decreased concentration, dysphoric mood and sleep disturbance. Negative for self-injury. The patient is  nervous/anxious.     Social History   Tobacco Use  . Smoking status: Never Smoker  . Smokeless tobacco: Never Used  Substance Use Topics  . Alcohol use: Yes    Alcohol/week: 7.2 oz    Types: 12 Cans of beer per week    Comment: occasionally   Objective:   BP 120/80 (BP Location: Left Arm, Patient Position: Sitting, Cuff Size: Normal)   Pulse 78   Temp 98 F (36.7 C) (Oral)   Resp 16   Wt 190 lb 12.8 oz (86.5 kg)   BMI 29.88 kg/m  Vitals:   04/11/18 1408  BP: 120/80  Pulse: 78  Resp: 16  Temp: 98 F (36.7 C)  TempSrc: Oral  Weight: 190 lb 12.8 oz (86.5 kg)     Physical Exam  Constitutional: He is oriented to person, place, and time. He appears well-developed and well-nourished. No distress.  Cardiovascular: Normal rate, regular rhythm, normal heart sounds and intact distal pulses. Exam reveals no gallop and no friction rub.  No murmur heard. Pulmonary/Chest: Effort normal. He has no wheezes. He has no rales. He exhibits no tenderness.  Neurological: He is alert and oriented to person, place, and time.  Skin: He is not diaphoretic.  Psychiatric: His speech is normal and behavior is normal. Judgment and thought content normal. His mood appears anxious. Cognition and memory are normal. He exhibits a depressed mood.    Depression screen [facility] 2/9 04/11/2018 11/05/2017 10/08/2017  Decreased Interest 3 3 3   Down, Depressed, Hopeless 3 0 3  PHQ - 2 Score 6 3 6   Altered sleeping 3 1 1  Tired, decreased energy 1 1 0  Change in appetite 2 2 2   Feeling bad or failure about yourself  3 3 3   Trouble concentrating 3 0 1  Moving slowly or fidgety/restless 1 0 0  Suicidal thoughts 1 0 0  PHQ-9 Score 20 10 13   Difficult doing work/chores Extremely dIfficult Somewhat difficult Very difficult    GAD 7 : Generalized Anxiety Score 04/11/2018  Nervous, Anxious, on Edge 3  Control/stop worrying 3  Worry too much - different things 3  Trouble relaxing 0  Restless 0  Easily annoyed or  irritable 2  Afraid - awful might happen 3  Total GAD 7 Score 14  Anxiety Difficulty Extremely difficult       Assessment & Plan:     1. Depression, major, single episode, moderate (HCC) Patient has tried and failed wellbutrin, citalopram, escitalopram, paroxetine, sertraline, duloxetine, venlafaxine and pristiq. Will try trintellix as below. Patient given samples for 21 days today in the office. Also patient finally agreeable to see counseling. This referral has been placed as below. Will also start Buspar as below for anxiety. I will see him back in 4-6 weeks to recheck. - vortioxetine HBr (TRINTELLIX) 10 MG TABS tablet; Take 1 tablet (10 mg total) by mouth daily.  Dispense: 90 tablet; Refill: 1 - Ambulatory referral to Psychology  2. GAD (generalized anxiety disorder) See above medical treatment plan. - vortioxetine HBr (TRINTELLIX) 10 MG TABS tablet; Take 1 tablet (10 mg total) by mouth daily.  Dispense: 90 tablet; Refill: 1 - busPIRone (BUSPAR) 5 MG tablet; Take 1 tablet (5 mg total) by mouth 3 (three) times daily.  Dispense: 270 tablet; Refill: 1 - Ambulatory referral to Psychology       Margaretann LovelessJennifer M Burnette, PA-C  [facility]

## 2018-05-09 ENCOUNTER — Encounter: Payer: Self-pay | Admitting: Physician Assistant

## 2018-05-09 ENCOUNTER — Telehealth: Payer: Self-pay | Admitting: Physician Assistant

## 2018-05-09 ENCOUNTER — Ambulatory Visit: Payer: BLUE CROSS/BLUE SHIELD | Admitting: Physician Assistant

## 2018-05-09 VITALS — BP 120/70 | HR 67 | Temp 98.2°F | Resp 16 | Wt 191.4 lb

## 2018-05-09 DIAGNOSIS — F418 Other specified anxiety disorders: Secondary | ICD-10-CM

## 2018-05-09 DIAGNOSIS — F331 Major depressive disorder, recurrent, moderate: Secondary | ICD-10-CM

## 2018-05-09 NOTE — Progress Notes (Signed)
Patient: Phillip Santana Male    DOB: 1971/04/21   47 y.o.   MRN: [id number] Visit Date: 05/09/2018  Today's Provider: Margaretann LovelessJennifer M [place] Hibberd, PA-C   Chief Complaint  Patient presents with  . Follow-up    Depression and GAD   Subjective:    HPI  Depression and GAD, Follow up:  The patient was last seen for Depression and Anxiety 4 weeks ago. Changes made since that visit include try Trintellix.Patient has tried and failed wellbutrin, citalopram, escitalopram, paroxetine, sertraline, duloxetine, venlafaxine and pristiq. patient finally agreed to see counseling. This referral was placed and several attempts were made to contact pt by referral coordinatoor and Harle BattiestJulia Tabor.Pt did not respond to calls.Appointment has not been made. Patient was also started on Buspar for anxiety.  He reports excellent compliance with treatment at the beginning but reports he didn't get the prescription filled because it was $50 dollars and was not sure if the medicine was helping. Patient reports that after finishing the samples he actually started to feel better than before he started the medicine so he is not sure if he needed to extra time on the medicine but was afraid to pay for it and not work. He reports today was his first day of feeling anxious and depressed again.    [facility] 05/09/2018 04/11/2018 11/05/2017  Decreased Interest 3 3 3   Down, Depressed, Hopeless 3 3 0  PHQ - 2 Score 6 6 3   Altered sleeping 3 3 1   Tired, decreased energy 1 1 1   Change in appetite 3 2 2   Feeling bad or failure about yourself  3 3 3   Trouble concentrating 1 3 0  Moving slowly or fidgety/restless 1 1 0  Suicidal thoughts 0 1 0  PHQ-9 Score 18 20 10   Difficult doing work/chores Extremely dIfficult Extremely dIfficult Somewhat difficult    GAD 7 : Generalized Anxiety Score 05/09/2018 04/11/2018  Nervous, Anxious, on Edge 2 3  Control/stop worrying 3 3  Worry too much - different things 3 3  Trouble  relaxing 0 0  Restless 0 0  Easily annoyed or irritable 2 2  Afraid - awful might happen 2 3  Total GAD 7 Score 12 14  Anxiety Difficulty Extremely difficult Extremely difficult       No Known Allergies   Current Outpatient Medications:  .  Lysine 1000 MG TABS, Take 1 tablet by mouth daily., Disp: , Rfl:  .  busPIRone (BUSPAR) 5 MG tablet, Take 1 tablet (5 mg total) by mouth 3 (three) times daily. (Patient not taking: Reported on 05/09/2018), Disp: 270 tablet, Rfl: 1 .  clotrimazole-betamethasone (LOTRISONE) cream, Apply 1 application topically 2 (two) times daily. (Patient not taking: Reported on 04/11/2018), Disp: 30 g, Rfl: 0 .  Multiple Vitamin (MULTIVITAMIN) capsule, Take 1 capsule by mouth daily., Disp: , Rfl:  .  predniSONE (STERAPRED UNI-PAK 21 TAB) 10 MG (21) TBPK tablet, 6 day taper; take as directed on package instructions (Patient not taking: Reported on 04/11/2018), Disp: 21 tablet, Rfl: 0 .  triamcinolone cream (KENALOG) 0.1 %, Apply 1 application topically 2 (two) times daily., Disp: , Rfl:  .  vortioxetine HBr (TRINTELLIX) 10 MG TABS tablet, Take 1 tablet (10 mg total) by mouth daily. (Patient not taking: Reported on 05/09/2018), Disp: 90 tablet, Rfl: 1  Review of Systems  Constitutional: Negative.   Respiratory: Negative.   Cardiovascular: Negative.   Neurological: Negative.   Psychiatric/Behavioral: Positive for  agitation and dysphoric mood. Negative for sleep disturbance. The patient is nervous/anxious.     Social History   Tobacco Use  . Smoking status: Never Smoker  . Smokeless tobacco: Never Used  Substance Use Topics  . Alcohol use: Yes    Alcohol/week: 12.0 standard drinks    Types: 12 Cans of beer per week    Comment: occasionally   Objective:   BP 120/70 (BP Location: Left Arm, Patient Position: Sitting, Cuff Size: Normal)   Pulse 67   Temp 98.2 F (36.8 C) (Oral)   Resp 16   Wt 191 lb 6.4 oz (86.8 kg)   SpO2 98%   BMI 29.98 kg/m  Vitals:    05/09/18 1606  BP: 120/70  Pulse: 67  Resp: 16  Temp: 98.2 F (36.8 C)  TempSrc: Oral  SpO2: 98%  Weight: 191 lb 6.4 oz (86.8 kg)     Physical Exam  Constitutional: He appears well-developed and well-nourished. No distress.  HENT:  Head: Normocephalic and atraumatic.  Neck: Normal range of motion. Neck supple.  Cardiovascular: Normal rate, regular rhythm and normal heart sounds. Exam reveals no gallop and no friction rub.  No murmur heard. Pulmonary/Chest: Effort normal and breath sounds normal. No respiratory distress. He has no wheezes. He has no rales.  Skin: He is not diaphoretic.  Psychiatric: He has a normal mood and affect. His speech is normal and behavior is normal. Judgment and thought content normal. Cognition and memory are normal.  Vitals reviewed.      Assessment & Plan:     1. Moderate episode of recurrent major depressive disorder (HCC) After a long discussion it was finally decided that we would hold off medications at this time as he seems to always feel better after "talking" with someone. He has a referral in place for counseling with Leavy Cella and she has reached out to him to schedule the appt. He reports he will call her back today to schedule as this is what seems to help him most. I encouraged him to do so. He has had a bad experience with counseling/psychiatry in the past and has always been stand-offish to start back. I have counseled and encouraged it will be better this time as we are doing strictly counseling. I feel if he could learn some coping mechanisms to help in the moment he will do much better. He agrees. I will see him back in 6-8 weeks after some counseling sessions to see how he is progressing and to see if it is felt medications are needed.   2. Situational anxiety See above medical treatment plan.  I spent approximately 40 minutes with the patient today. Over 50% of this time was spent with counseling and educating the patient.       Margaretann Loveless, PA-C  [facility]

## 2018-05-09 NOTE — Telephone Encounter (Signed)
Pt was referred to Phillip Santana for depression and anxiety.Several attempts were made to contact pt by myself and Phillip Santana.Pt did not respond to calls.Appointment has not been made

## 2018-05-09 NOTE — Telephone Encounter (Signed)
Noted  

## 2018-05-15 ENCOUNTER — Encounter: Payer: Self-pay | Admitting: Physician Assistant

## 2018-05-22 DIAGNOSIS — F411 Generalized anxiety disorder: Secondary | ICD-10-CM | POA: Diagnosis not present

## 2018-05-28 DIAGNOSIS — F411 Generalized anxiety disorder: Secondary | ICD-10-CM | POA: Diagnosis not present

## 2018-05-29 DIAGNOSIS — F411 Generalized anxiety disorder: Secondary | ICD-10-CM | POA: Diagnosis not present

## 2018-06-20 ENCOUNTER — Encounter: Payer: Self-pay | Admitting: Physician Assistant

## 2018-06-20 ENCOUNTER — Ambulatory Visit: Payer: BLUE CROSS/BLUE SHIELD | Admitting: Physician Assistant

## 2018-06-20 VITALS — BP 110/60 | HR 60 | Temp 97.8°F | Resp 16 | Wt 195.4 lb

## 2018-06-20 DIAGNOSIS — H1013 Acute atopic conjunctivitis, bilateral: Secondary | ICD-10-CM | POA: Diagnosis not present

## 2018-06-20 DIAGNOSIS — J301 Allergic rhinitis due to pollen: Secondary | ICD-10-CM

## 2018-06-20 DIAGNOSIS — F3132 Bipolar disorder, current episode depressed, moderate: Secondary | ICD-10-CM

## 2018-06-20 MED ORDER — FLUTICASONE FUROATE 27.5 MCG/SPRAY NA SUSP: 2.0000 | Freq: Every day | NASAL | 12 refills | Status: DC

## 2018-06-20 MED ORDER — LAMOTRIGINE 25 MG PO TABS: 25.0000 mg | ORAL_TABLET | Freq: Every day | ORAL | 0 refills | Status: DC

## 2018-06-20 MED ORDER — AZELASTINE HCL 0.05 % OP SOLN: 1.0000 [drp] | Freq: Two times a day (BID) | OPHTHALMIC | 12 refills | Status: DC

## 2018-06-20 NOTE — Progress Notes (Signed)
Patient: Phillip Santana Male    DOB: 04/23/1971   47 y.o.   MRN: [id number] Visit Date: 06/20/2018  Today's Provider: Margaretann LovelessJennifer M Burnette, PA-C   Chief Complaint  Patient presents with  . Allergies  . Depression   Subjective:    HPI Patient here today with c/o allergies.Her reports his eyes are itching, ear popping, pressure headache, runny nose, itchy throat, postnasal drip. Treatments tried:Loratidine and eye drops (old prescription).   Depression: Patient reports that is worsening.Reports that he has been to the psychologist and try to go to her this week and reports he left a voicemail but has not heard from them. No suicidal ideation or thoughts.    No Known Allergies   Current Outpatient Medications:  .  Lysine 1000 MG TABS, Take 1 tablet by mouth daily., Disp: , Rfl:  .  Multiple Vitamin (MULTIVITAMIN) capsule, Take 1 capsule by mouth daily., Disp: , Rfl:   Review of Systems  Constitutional: Negative.  Negative for fever.  HENT: Positive for congestion, postnasal drip, sinus pressure, sinus pain and sneezing. Negative for ear pain (pressure).   Eyes: Positive for itching.  Respiratory: Negative for chest tightness, shortness of breath and wheezing.   Cardiovascular: Negative for chest pain, palpitations and leg swelling.  Neurological: Positive for headaches.  Psychiatric/Behavioral: Positive for agitation, decreased concentration, dysphoric mood and sleep disturbance. Negative for self-injury and suicidal ideas. The patient is nervous/anxious.     Social History   Tobacco Use  . Smoking status: Never Smoker  . Smokeless tobacco: Never Used  Substance Use Topics  . Alcohol use: Yes    Alcohol/week: 12.0 standard drinks    Types: 12 Cans of beer per week    Comment: occasionally   Objective:   BP 110/60 (BP Location: Left Arm, Patient Position: Sitting, Cuff Size: Normal)   Pulse 60   Temp 97.8 F (36.6 C) (Oral)   Resp 16   Wt 195 lb 6.4 oz (88.6 kg)    BMI 30.60 kg/m  Vitals:   06/20/18 0947  BP: 110/60  Pulse: 60  Resp: 16  Temp: 97.8 F (36.6 C)  TempSrc: Oral  Weight: 195 lb 6.4 oz (88.6 kg)     Physical Exam  Constitutional: He appears well-developed and well-nourished. No distress.  HENT:  Head: Normocephalic and atraumatic.  Right Ear: Hearing, tympanic membrane and external ear normal.  Left Ear: Hearing, tympanic membrane and external ear normal.  Nose: Nose normal.  Mouth/Throat: Oropharynx is clear and moist. No oropharyngeal exudate.  Eyes: Pupils are equal, round, and reactive to light. Conjunctivae and EOM are normal. Right eye exhibits no discharge. Left eye exhibits no discharge.  Neck: Normal range of motion. Neck supple. No JVD present. No tracheal deviation present. No Brudzinski's sign and no Kernig's sign noted. No thyromegaly present.  Cardiovascular: Normal rate, regular rhythm and normal heart sounds. Exam reveals no gallop and no friction rub.  No murmur heard. Pulmonary/Chest: Effort normal and breath sounds normal. No stridor. No respiratory distress. He has no wheezes. He has no rales. He exhibits no tenderness.  Lymphadenopathy:    He has no cervical adenopathy.  Skin: Skin is warm and dry.       Assessment & Plan:     1. Seasonal allergic rhinitis due to pollen Add flonase sensimist as below. Change claritin to zyrtec, allegra or xyzal. Start nasal irrigations.  - fluticasone (FLONASE SENSIMIST) 27.5 MCG/SPRAY nasal spray; Place 2 sprays  into the nose daily.  Dispense: 10 g; Refill: 12  2. Allergic conjunctivitis of both eyes Failed OTC allergy eye drops. Will start optivar as below.  - azelastine (OPTIVAR) 0.05 % ophthalmic solution; Place 1 drop into both eyes 2 (two) times daily.  Dispense: 6 mL; Refill: 12  3. Bipolar affective disorder, currently depressed, moderate (HCC) Failed multiple SSRIs, SNRIs, and wellbutrin. Has seen counselor, Harle Battiest, twice and she recommended mood  stabilizing medications. Possibly consider ADHD testing. Will start with trial of lamictal as below. I recommended him to ask Ms. Guss Bunde to perform ADHD testing on him. If positive we will consider medication management. I will see him back in 4 weeks. - lamoTRIgine (LAMICTAL) 25 MG tablet; Take 1 tablet (25 mg total) by mouth daily. May increase to two tabs daily after 2 weeks  Dispense: 60 tablet; Refill: 0       Margaretann Loveless, PA-C  [facility]

## 2018-06-20 NOTE — Patient Instructions (Signed)
Lamotrigine tablets What is this medicine? LAMOTRIGINE (la MOE tri jeen) is used to control seizures in adults and children with epilepsy and Lennox-Gastaut syndrome. It is also used in adults to treat bipolar disorder. This medicine may be used for other purposes; ask your health care provider or pharmacist if you have questions. COMMON BRAND NAME(S): Lamictal What should I tell my health care provider before I take this medicine? They need to know if you have any of these conditions: -a history of depression or bipolar disorder -aseptic meningitis during prior use of lamotrigine -folate deficiency -kidney disease -liver disease -suicidal thoughts, plans, or attempt; a previous suicide attempt by you or a family member -an unusual or allergic reaction to lamotrigine or other seizure medications, other medicines, foods, dyes, or preservatives -pregnant or trying to get pregnant -breast-feeding How should I use this medicine? Take this medicine by mouth with a glass of water. Follow the directions on the prescription label. Do not chew these tablets. If this medicine upsets your stomach, take it with food or milk. Take your doses at regular intervals. Do not take your medicine more often than directed. A special MedGuide will be given to you by the pharmacist with each new prescription and refill. Be sure to read this information carefully each time. Talk to your pediatrician regarding the use of this medicine in children. While this drug may be prescribed for children as young as 2 years for selected conditions, precautions do apply. Overdosage: If you think you have taken too much of this medicine contact a poison control center or emergency room at once. NOTE: This medicine is only for you. Do not share this medicine with others. What if I miss a dose? If you miss a dose, take it as soon as you can. If it is almost time for your next dose, take only that dose. Do not take double or extra  doses. What may interact with this medicine? -carbamazepine -male hormones, including contraceptive or birth control pills -methotrexate -phenobarbital -phenytoin -primidone -pyrimethamine -rifampin -trimethoprim -valproic acid This list may not describe all possible interactions. Give your health care provider a list of all the medicines, herbs, non-prescription drugs, or dietary supplements you use. Also tell them if you smoke, drink alcohol, or use illegal drugs. Some items may interact with your medicine. What should I watch for while using this medicine? Visit your doctor or health care professional for regular checks on your progress. If you take this medicine for seizures, wear a Medic Alert bracelet or necklace. Carry an identification card with information about your condition, medicines, and doctor or health care professional. It is important to take this medicine exactly as directed. When first starting treatment, your dose will need to be adjusted slowly. It may take weeks or months before your dose is stable. You should contact your doctor or health care professional if your seizures get worse or if you have any new types of seizures. Do not stop taking this medicine unless instructed by your doctor or health care professional. Stopping your medicine suddenly can increase your seizures or their severity. Contact your doctor or health care professional right away if you develop a rash while taking this medicine. Rashes may be very severe and sometimes require treatment in the hospital. Deaths from rashes have occurred. Serious rashes occur more often in children than adults taking this medicine. It is more common for these serious rashes to occur during the first 2 months of treatment, but a rash can   occur at any time. You may get drowsy, dizzy, or have blurred vision. Do not drive, use machinery, or do anything that needs mental alertness until you know how this medicine affects you.  To reduce dizzy or fainting spells, do not sit or stand up quickly, especially if you are an older patient. Alcohol can increase drowsiness and dizziness. Avoid alcoholic drinks. If you are taking this medicine for bipolar disorder, it is important to report any changes in your mood to your doctor or health care professional. If your condition gets worse, you get mentally depressed, feel very hyperactive or manic, have difficulty sleeping, or have thoughts of hurting yourself or committing suicide, you need to get help from your health care professional right away. If you are a caregiver for someone taking this medicine for bipolar disorder, you should also report these behavioral changes right away. The use of this medicine may increase the chance of suicidal thoughts or actions. Pay special attention to how you are responding while on this medicine. Your mouth may get dry. Chewing sugarless gum or sucking hard candy, and drinking plenty of water may help. Contact your doctor if the problem does not go away or is severe. Women who become pregnant while using this medicine may enroll in the [place] Antiepileptic Drug Pregnancy Registry by calling 1-888-233-2334. This registry collects information about the safety of antiepileptic drug use during pregnancy. What side effects may I notice from receiving this medicine? Side effects that you should report to your doctor or health care professional as soon as possible: -allergic reactions like skin rash, itching or hives, swelling of the face, lips, or tongue -blurred or double vision -difficulty walking or controlling muscle movements -fever -headache, stiff neck, and sensitivity to light -painful sores in the mouth, eyes, or nose -redness, blistering, peeling or loosening of the skin, including inside the mouth -severe muscle pain -swollen lymph glands -uncontrollable eye movements -unusual bruising or bleeding -unusually weak or  tired -vomiting -worsening of mood, thoughts or actions of suicide or dying -yellowing of the eyes or skin Side effects that usually do not require medical attention (report to your doctor or health care professional if they continue or are bothersome): -diarrhea or constipation -difficulty sleeping -nausea -tremors This list may not describe all possible side effects. Call your doctor for medical advice about side effects. You may report side effects to FDA at 1-800-FDA-1088. Where should I keep my medicine? Keep out of reach of children. Store at room temperature between 15 and 30 degrees C (59 and 86 degrees F). Throw away any unused medicine after the expiration date. NOTE: This sheet is a summary. It may not cover all possible information. If you have questions about this medicine, talk to your doctor, pharmacist, or health care provider.  2018 [employer]/Gold Standard (2015-10-21 09:29:40)  

## 2018-07-17 ENCOUNTER — Encounter: Payer: Self-pay | Admitting: Physician Assistant

## 2018-07-17 ENCOUNTER — Ambulatory Visit: Payer: BLUE CROSS/BLUE SHIELD | Admitting: Physician Assistant

## 2018-07-17 VITALS — BP 122/80 | HR 86 | Temp 97.7°F | Resp 16 | Wt 200.0 lb

## 2018-07-17 DIAGNOSIS — F3132 Bipolar disorder, current episode depressed, moderate: Secondary | ICD-10-CM

## 2018-07-17 DIAGNOSIS — L2082 Flexural eczema: Secondary | ICD-10-CM | POA: Diagnosis not present

## 2018-07-17 MED ORDER — TRIAMCINOLONE ACETONIDE 0.1 % EX CREA: 1.0000 "application " | TOPICAL_CREAM | Freq: Two times a day (BID) | CUTANEOUS | 0 refills | Status: DC

## 2018-07-17 MED ORDER — LAMOTRIGINE 100 MG PO TABS: 100.0000 mg | ORAL_TABLET | Freq: Every day | ORAL | 0 refills | Status: DC

## 2018-07-17 NOTE — Patient Instructions (Addendum)
Living With Bipolar Disorder If you have been diagnosed with bipolar disorder, you may be relieved that you now know why you have felt or behaved a certain way. You may also feel overwhelmed about the treatment ahead, how to get the support you need, and how to deal with the condition day-to-day. With care and support, you can learn to manage your symptoms and live with bipolar disorder. How to manage lifestyle changes Managing stress Stress is your body's reaction to life changes and events, both good and bad. Stress can play a major role in bipolar disorder, so it is important to learn how to cope with stress. Some techniques to cope with stress include:  Meditation, muscle relaxation, and breathing exercises.  Exercise. Even a short daily walk can help to lower stress levels.  Getting enough good-quality sleep. Too little sleep can cause mania to start (can trigger mania).  Making a schedule to manage your time. Knowing your daily schedule can help to keep you from feeling overwhelmed by tasks and deadlines.  Spending time on hobbies that you enjoy.  Medicines Your health care provider may suggest certain medicines if he or she feels that they will help improve your condition. Avoid using caffeine, alcohol, and other substances that may prevent your medicines from working properly (may interact). It is also important to:  Talk with your pharmacist or health care provider about all the medicines that you take, their possible side effects, and which medicines are safe to take together.  Make it your goal to take part in all treatment decisions (shared decision-making). Ask about possible side effects of medicines that your health care provider recommends, and tell him or her how you feel about having those side effects. It is best if shared decision-making with your health care provider is part of your total treatment plan.  If you are taking medicines as part of your treatment, do not stop  taking medicines before you ask your health care provider if it is safe to stop. You may need to have the medicine slowly decreased (tapered) over time to decrease the risk of harmful side effects. Relationships Spend time with people that you trust and with whom you feel a sense of understanding and calm. Try to find friends or family members who make you feel safe and can help you control feelings of mania. Consider going to couples counseling, family education classes, or family therapy to:  Educate your loved ones about your condition and offer suggestions about how they can support you.  Help resolve conflicts.  Help develop communication skills in your relationships.  How to recognize changes in your condition Everyone responds differently to treatment for bipolar disorder. Some signs that your condition is improving include:  Leveling of your mood. You may have less anger and excitement about daily activities, and your low moods may not be as bad.  Your symptoms being less intense.  Feeling calm more often.  Thinking clearly.  Not experiencing consequences for extreme behavior.  Feeling like your life is settling down.  Your behavior seeming more normal to you and to other people.  Some signs that your condition may be getting worse include:  Sleep problems.  Moods cycling between deep lows and unusually high (excess) energy.  Extreme emotions.  More anger at loved ones.  Staying away from others (isolating yourself).  A feeling of power or superiority.  Completing a lot of tasks in a very short amount of time.  Unusual thoughts and behaviors.    Suicidal thoughts.  Where to find support Talking to others  Try making a list of the people you may want to tell about your condition, such as the people you trust most.  Plan what you are willing to talk about and what you do not want to discuss. Think about your needs ahead of time, and how your friends and family  members can support you.  Let your loved ones know when they can share advice and when you would just like them to listen.  Give your loved ones information about bipolar disorder, and encourage them to learn about the condition. Finances Not all insurance plans cover mental health care, so it is important to check with your insurance carrier. If paying for co-pays or counseling services is a problem, search for a local or county mental health care center. Public mental health care services may be offered there at a low cost or no cost when you are not able to see a private health care provider. If you are taking medicine for depression, you may be able to get the generic form, which may be less expensive than brand-name medicine. Some makers of prescription medicines also offer help to patients who cannot afford the medicines they need. Follow these instructions at home: Medicines  Take over-the-counter and prescription medicines only as told by your health care provider or pharmacist.  Ask your pharmacist what over-the-counter cold medicines you should avoid. Some medicines can make symptoms worse. General instructions  Ask for support from trusted family members or friends to make sure you stay on track with your treatment.  Keep a journal to write down your daily moods, medicines, sleep habits, and life events. This may help you have more success with your treatment.  Make and follow a routine for daily meal times. Eat healthy foods, such as whole grains, vegetables, and fresh fruit.  Try to go to sleep and wake up around the same time every day.  Keep all follow-up visits as told by your health care provider. This is important. Questions to ask your health care provider:  If you are taking medicines: ? How long do I need to take medicine? ? Are there any long-term side effects of my medicine? ? Are there any alternatives to taking medicine?  How would I benefit from  therapy?  How often should I follow up with a health care provider? Contact a health care provider if:  Your symptoms get worse or they do not get better with treatment. Get help right away if:  You have thoughts about harming yourself or others. If you ever feel like you may hurt yourself or others, or have thoughts about taking your own life, get help right away. You can go to your nearest emergency department or call:  Your local emergency services (911 in the [place]  A suicide crisis helpline, such as the National Suicide Prevention Lifeline at 1-800-273-8255. This is open 24-hours a day.  Summary  Learning ways to deal with stress can help to calm you and may also help your treatment work better.  There is a wide range of medicines that can help to treat bipolar disorder.  Having healthy relationships can help to make your moods more stable.  Contact a health care provider if your symptoms get worse or they do not get better with treatment. This information is not intended to replace advice given to you by your health care provider. Make sure you discuss any questions you have with your   health care provider. Document Released: 01/18/2017 Document Revised: 01/18/2017 Document Reviewed: 01/18/2017 Elsevier Interactive Patient Education  2018 [employer].  Lamotrigine tablets What is this medicine? LAMOTRIGINE (la MOE Patrecia Pace) is used to control seizures in adults and children with epilepsy and Lennox-Gastaut syndrome. It is also used in adults to treat bipolar disorder. This medicine may be used for other purposes; ask your health care provider or pharmacist if you have questions. COMMON BRAND NAME(S): Lamictal What should I tell my health care provider before I take this medicine? They need to know if you have any of these conditions: -a history of depression or bipolar disorder -aseptic meningitis during prior use of lamotrigine -folate deficiency -kidney disease -liver  disease -suicidal thoughts, plans, or attempt; a previous suicide attempt by you or a family member -an unusual or allergic reaction to lamotrigine or other seizure medications, other medicines, foods, dyes, or preservatives -pregnant or trying to get pregnant -breast-feeding How should I use this medicine? Take this medicine by mouth with a glass of water. Follow the directions on the prescription label. Do not chew these tablets. If this medicine upsets your stomach, take it with food or milk. Take your doses at regular intervals. Do not take your medicine more often than directed. A special MedGuide will be given to you by the pharmacist with each new prescription and refill. Be sure to read this information carefully each time. Talk to your pediatrician regarding the use of this medicine in children. While this drug may be prescribed for children as young as 2 years for selected conditions, precautions do apply. Overdosage: If you think you have taken too much of this medicine contact a poison control center or emergency room at once. NOTE: This medicine is only for you. Do not share this medicine with others. What if I miss a dose? If you miss a dose, take it as soon as you can. If it is almost time for your next dose, take only that dose. Do not take double or extra doses. What may interact with this medicine? -carbamazepine -male hormones, including contraceptive or birth control pills -methotrexate -phenobarbital -phenytoin -primidone -pyrimethamine -rifampin -trimethoprim -valproic acid This list may not describe all possible interactions. Give your health care provider a list of all the medicines, herbs, non-prescription drugs, or dietary supplements you use. Also tell them if you smoke, drink alcohol, or use illegal drugs. Some items may interact with your medicine. What should I watch for while using this medicine? Visit your doctor or health care professional for regular  checks on your progress. If you take this medicine for seizures, wear a Medic Alert bracelet or necklace. Carry an identification card with information about your condition, medicines, and doctor or health care professional. It is important to take this medicine exactly as directed. When first starting treatment, your dose will need to be adjusted slowly. It may take weeks or months before your dose is stable. You should contact your doctor or health care professional if your seizures get worse or if you have any new types of seizures. Do not stop taking this medicine unless instructed by your doctor or health care professional. Stopping your medicine suddenly can increase your seizures or their severity. Contact your doctor or health care professional right away if you develop a rash while taking this medicine. Rashes may be very severe and sometimes require treatment in the hospital. Deaths from rashes have occurred. Serious rashes occur more often in children than adults taking this  medicine. It is more common for these serious rashes to occur during the first 2 months of treatment, but a rash can occur at any time. You may get drowsy, dizzy, or have blurred vision. Do not drive, use machinery, or do anything that needs mental alertness until you know how this medicine affects you. To reduce dizzy or fainting spells, do not sit or stand up quickly, especially if you are an older patient. Alcohol can increase drowsiness and dizziness. Avoid alcoholic drinks. If you are taking this medicine for bipolar disorder, it is important to report any changes in your mood to your doctor or health care professional. If your condition gets worse, you get mentally depressed, feel very hyperactive or manic, have difficulty sleeping, or have thoughts of hurting yourself or committing suicide, you need to get help from your health care professional right away. If you are a caregiver for someone taking this medicine for bipolar  disorder, you should also report these behavioral changes right away. The use of this medicine may increase the chance of suicidal thoughts or actions. Pay special attention to how you are responding while on this medicine. Your mouth may get dry. Chewing sugarless gum or sucking hard candy, and drinking plenty of water may help. Contact your doctor if the problem does not go away or is severe. Women who become pregnant while using this medicine may enroll in the [place] American Antiepileptic Drug Pregnancy Registry by calling [phone]. This registry collects information about the safety of antiepileptic drug use during pregnancy. What side effects may I notice from receiving this medicine? Side effects that you should report to your doctor or health care professional as soon as possible: -allergic reactions like skin rash, itching or hives, swelling of the face, lips, or tongue -blurred or double vision -difficulty walking or controlling muscle movements -fever -headache, stiff neck, and sensitivity to light -painful sores in the mouth, eyes, or nose -redness, blistering, peeling or loosening of the skin, including inside the mouth -severe muscle pain -swollen lymph glands -uncontrollable eye movements -unusual bruising or bleeding -unusually weak or tired -vomiting -worsening of mood, thoughts or actions of suicide or dying -yellowing of the eyes or skin Side effects that usually do not require medical attention (report to your doctor or health care professional if they continue or are bothersome): -diarrhea or constipation -difficulty sleeping -nausea -tremors This list may not describe all possible side effects. Call your doctor for medical advice about side effects. You may report side effects to FDA at 1-800-FDA-1088. Where should I keep my medicine? Keep out of reach of children. Store at room temperature between 15 and 30 degrees C (59 and 86 degrees F). Throw away any unused  medicine after the expiration date. NOTE: This sheet is a summary. It may not cover all possible information. If you have questions about this medicine, talk to your doctor, pharmacist, or health care provider.  2018 Elsevier/Gold Standard (2015-10-21 09:29:40)

## 2018-07-17 NOTE — Progress Notes (Signed)
Patient: Phillip Santana Male    DOB: October 23, 1970   47 y.o.   MRN: [id number] Visit Date: 07/17/2018  Today's Provider: Margaretann Loveless, PA-C   Chief Complaint  Patient presents with  . Follow-up    Bipolar disorder, Depression   Subjective:    HPI  Bipolar affective disorder,depression, Follow up:  The patient was last seen for this 4 weeks ago. Changes made since that visit include start Lamictal.  He reports excellent compliance with treatment. He is not having side effects.  Reports that he is not doing good with the medicine. Reports that he doubled it and "today is good day to talk to the provider" because he is having one of those days. Reports that he just don't want to do anything and just feels so depressed. Reports he get the thoughts of suicidal but then thinks aloud how much he loves his family and how much his family loves him.     No Known Allergies   Current Outpatient Medications:  .  B COMPLEX VITAMINS ER PO, Take by mouth., Disp: , Rfl:  .  lamoTRIgine (LAMICTAL) 25 MG tablet, Take 1 tablet (25 mg total) by mouth daily. May increase to two tabs daily after 2 weeks, Disp: 60 tablet, Rfl: 0 .  Lysine 1000 MG TABS, Take 1 tablet by mouth daily., Disp: , Rfl:  .  VITAMIN E PO, Take by mouth., Disp: , Rfl:  .  azelastine (OPTIVAR) 0.05 % ophthalmic solution, Place 1 drop into both eyes 2 (two) times daily. (Patient not taking: Reported on 07/17/2018), Disp: 6 mL, Rfl: 12 .  fluticasone (FLONASE SENSIMIST) 27.5 MCG/SPRAY nasal spray, Place 2 sprays into the nose daily. (Patient not taking: Reported on 07/17/2018), Disp: 10 g, Rfl: 12 .  Multiple Vitamin (MULTIVITAMIN) capsule, Take 1 capsule by mouth daily., Disp: , Rfl:   Review of Systems  Constitutional: Positive for fatigue.  Respiratory: Negative.   Cardiovascular: Negative.   Gastrointestinal: Negative.   Genitourinary: Negative.   Neurological: Negative.   Psychiatric/Behavioral: Positive for  agitation, decreased concentration, dysphoric mood and sleep disturbance. The patient is nervous/anxious.     Social History   Tobacco Use  . Smoking status: Never Smoker  . Smokeless tobacco: Never Used  Substance Use Topics  . Alcohol use: Yes    Alcohol/week: 12.0 standard drinks    Types: 12 Cans of beer per week    Comment: occasionally   Objective:   BP 122/80 (BP Location: Left Arm, Patient Position: Sitting, Cuff Size: Normal)   Pulse 86   Temp 97.7 F (36.5 C) (Oral)   Resp 16   Wt 200 lb (90.7 kg)   BMI 31.32 kg/m  Vitals:   07/17/18 1542  BP: 122/80  Pulse: 86  Resp: 16  Temp: 97.7 F (36.5 C)  TempSrc: Oral  Weight: 200 lb (90.7 kg)     Physical Exam  Constitutional: He is oriented to person, place, and time. He appears well-developed and well-nourished. No distress.  Cardiovascular: Normal rate, regular rhythm, normal heart sounds and intact distal pulses. Exam reveals no gallop and no friction rub.  No murmur heard. Pulmonary/Chest: Effort normal. He has no wheezes. He has no rales. He exhibits no tenderness.  Neurological: He is alert and oriented to person, place, and time.  Skin: He is not diaphoretic.  Psychiatric: His speech is normal and behavior is normal. Judgment and thought content normal. Cognition and memory are normal. He  exhibits a depressed mood (tearful).        Assessment & Plan:     1. Bipolar 1 disorder, depressed, moderate (HCC) Increase lamictal to 100mg  as below. May increase to 200mg  in 2 weeks if needed. I will see him back in 4 weeks. Call if worsening.  - lamoTRIgine (LAMICTAL) 100 MG tablet; Take 1 tablet (100 mg total) by mouth daily. Increase to 2 tabs (200mg ) in 2 weeks if needed  Dispense: 60 tablet; Refill: 0 - Ambulatory referral to Psychiatry  2. Flexural eczema Will treat with triamcinolone cream as below. If worsening he is to call.  - triamcinolone cream (KENALOG) 0.1 %; Apply 1 application topically 2 (two)  times daily.  Dispense: 30 g; Refill: 0       Margaretann Loveless, PA-C  [facility]

## 2018-07-19 ENCOUNTER — Encounter: Payer: Self-pay | Admitting: Physician Assistant

## 2018-07-19 ENCOUNTER — Ambulatory Visit: Payer: BLUE CROSS/BLUE SHIELD | Admitting: Physician Assistant

## 2018-07-19 VITALS — BP 120/80 | HR 68 | Temp 98.2°F | Resp 16 | Wt 199.0 lb

## 2018-07-19 DIAGNOSIS — R52 Pain, unspecified: Secondary | ICD-10-CM | POA: Diagnosis not present

## 2018-07-19 DIAGNOSIS — B369 Superficial mycosis, unspecified: Secondary | ICD-10-CM

## 2018-07-19 DIAGNOSIS — J020 Streptococcal pharyngitis: Secondary | ICD-10-CM

## 2018-07-19 DIAGNOSIS — J029 Acute pharyngitis, unspecified: Secondary | ICD-10-CM

## 2018-07-19 LAB — POCT INFLUENZA A/B
Influenza A, POC: NEGATIVE
Influenza B, POC: NEGATIVE

## 2018-07-19 LAB — POCT RAPID STREP A (OFFICE): Rapid Strep A Screen: POSITIVE — AB

## 2018-07-19 MED ORDER — CLOTRIMAZOLE-BETAMETHASONE 1-0.05 % EX CREA: 1.0000 "application " | TOPICAL_CREAM | Freq: Two times a day (BID) | CUTANEOUS | 0 refills | Status: DC

## 2018-07-19 MED ORDER — AMOXICILLIN 875 MG PO TABS: 875.0000 mg | ORAL_TABLET | Freq: Two times a day (BID) | ORAL | 0 refills | Status: DC

## 2018-07-19 NOTE — Patient Instructions (Signed)
Strep Throat Strep throat is a bacterial infection of the throat. Your health care provider may call the infection tonsillitis or pharyngitis, depending on whether there is swelling in the tonsils or at the back of the throat. Strep throat is most common during the cold months of the year in children who are 5-46 years of age, but it can happen during any season in people of any age. This infection is spread from person to person (contagious) through coughing, sneezing, or close contact. What are the causes? Strep throat is caused by the bacteria called Streptococcus pyogenes. What increases the risk? This condition is more likely to develop in:  People who spend time in crowded places where the infection can spread easily.  People who have close contact with someone who has strep throat.  What are the signs or symptoms? Symptoms of this condition include:  Fever or chills.  Redness, swelling, or pain in the tonsils or throat.  Pain or difficulty when swallowing.  White or yellow spots on the tonsils or throat.  Swollen, tender glands in the neck or under the jaw.  Red rash all over the body (rare).  How is this diagnosed? This condition is diagnosed by performing a rapid strep test or by taking a swab of your throat (throat culture test). Results from a rapid strep test are usually ready in a few minutes, but throat culture test results are available after one or two days. How is this treated? This condition is treated with antibiotic medicine. Follow these instructions at home: Medicines  Take over-the-counter and prescription medicines only as told by your health care provider.  Take your antibiotic as told by your health care provider. Do not stop taking the antibiotic even if you start to feel better.  Have family members who also have a sore throat or fever tested for strep throat. They may need antibiotics if they have the strep infection. Eating and drinking  Do not  share food, drinking cups, or personal items that could cause the infection to spread to other people.  If swallowing is difficult, try eating soft foods until your sore throat feels better.  Drink enough fluid to keep your urine clear or pale yellow. General instructions  Gargle with a salt-water mixture 3-4 times per day or as needed. To make a salt-water mixture, completely dissolve -1 tsp of salt in 1 cup of warm water.  Make sure that all household members wash their hands well.  Get plenty of rest.  Stay home from school or work until you have been taking antibiotics for 24 hours.  Keep all follow-up visits as told by your health care provider. This is important. Contact a health care provider if:  The glands in your neck continue to get bigger.  You develop a rash, cough, or earache.  You cough up a thick liquid that is green, yellow-brown, or bloody.  You have pain or discomfort that does not get better with medicine.  Your problems seem to be getting worse rather than better.  You have a fever. Get help right away if:  You have new symptoms, such as vomiting, severe headache, stiff or painful neck, chest pain, or shortness of breath.  You have severe throat pain, drooling, or changes in your voice.  You have swelling of the neck, or the skin on the neck becomes red and tender.  You have signs of dehydration, such as fatigue, dry mouth, and decreased urination.  You become increasingly sleepy, or   you cannot wake up completely.  Your joints become red or painful. This information is not intended to replace advice given to you by your health care provider. Make sure you discuss any questions you have with your health care provider. Document Released: 09/15/2000 Document Revised: 05/17/2016 Document Reviewed: 01/11/2015 Elsevier Interactive Patient Education  2018 [employer]  

## 2018-07-19 NOTE — Progress Notes (Signed)
Patient: Phillip Santana Male    DOB: 06-05-1971   47 y.o.   MRN: [id number] Visit Date: 07/19/2018  Today's Provider: Margaretann Loveless, PA-C   Chief Complaint  Patient presents with  . Generalized Body Aches   Subjective:    HPI Patient here today with c/o body aches and fatigue.Patient reports that his symptoms started Thursday morning with a sore throat. He reports having body aches and swollen glands with some pressure in his jaw. He reports he took 5 IBU of 200mg  3 hrs ago. He has been having chills. Denies Cough, runny nose,ear pain, sinus pressure or HA. He has tried Claritin, nasal spray.     No Known Allergies   Current Outpatient Medications:  .  B COMPLEX VITAMINS ER PO, Take by mouth., Disp: , Rfl:  .  lamoTRIgine (LAMICTAL) 100 MG tablet, Take 1 tablet (100 mg total) by mouth daily. Increase to 2 tabs (200mg ) in 2 weeks if needed, Disp: 60 tablet, Rfl: 0 .  Lysine 1000 MG TABS, Take 1 tablet by mouth daily., Disp: , Rfl:  .  Multiple Vitamin (MULTIVITAMIN) capsule, Take 1 capsule by mouth daily., Disp: , Rfl:  .  VITAMIN E PO, Take by mouth., Disp: , Rfl:  .  azelastine (OPTIVAR) 0.05 % ophthalmic solution, Place 1 drop into both eyes 2 (two) times daily. (Patient not taking: Reported on 07/17/2018), Disp: 6 mL, Rfl: 12 .  fluticasone (FLONASE SENSIMIST) 27.5 MCG/SPRAY nasal spray, Place 2 sprays into the nose daily. (Patient not taking: Reported on 07/17/2018), Disp: 10 g, Rfl: 12 .  triamcinolone cream (KENALOG) 0.1 %, Apply 1 application topically 2 (two) times daily., Disp: 30 g, Rfl: 0  Review of Systems  Constitutional: Positive for chills and fatigue. Negative for fever.  HENT: Positive for sore throat. Negative for congestion, postnasal drip, rhinorrhea, sinus pressure and sinus pain.   Respiratory: Negative for chest tightness and shortness of breath.   Cardiovascular: Negative for chest pain, palpitations and leg swelling.  Neurological: Negative for  headaches.    Social History   Tobacco Use  . Smoking status: Never Smoker  . Smokeless tobacco: Never Used  Substance Use Topics  . Alcohol use: Yes    Alcohol/week: 12.0 standard drinks    Types: 12 Cans of beer per week    Comment: occasionally   Objective:   BP 120/80 (BP Location: Left Arm, Patient Position: Sitting, Cuff Size: Normal)   Pulse 68   Temp 98.2 F (36.8 C) (Oral)   Resp 16   Wt 199 lb (90.3 kg)   SpO2 98%   BMI 31.17 kg/m  Vitals:   07/19/18 1454  BP: 120/80  Pulse: 68  Resp: 16  Temp: 98.2 F (36.8 C)  TempSrc: Oral  SpO2: 98%  Weight: 199 lb (90.3 kg)     Physical Exam  Constitutional: He appears well-developed and well-nourished. No distress.  HENT:  Head: Normocephalic and atraumatic.  Right Ear: Hearing, tympanic membrane, external ear and ear canal normal.  Left Ear: Hearing, tympanic membrane, external ear and ear canal normal.  Nose: Nose normal.  Mouth/Throat: Oropharyngeal exudate, posterior oropharyngeal edema and posterior oropharyngeal erythema present.  Eyes: Pupils are equal, round, and reactive to light. Conjunctivae and EOM are normal. Right eye exhibits no discharge. Left eye exhibits no discharge.  Neck: Normal range of motion. Neck supple. No JVD present. No tracheal deviation present. No Brudzinski's sign and no Kernig's sign noted. No  thyromegaly present.  Cardiovascular: Normal rate, regular rhythm and normal heart sounds. Exam reveals no gallop and no friction rub.  No murmur heard. Pulmonary/Chest: Effort normal and breath sounds normal. No stridor. No respiratory distress. He has no wheezes. He has no rales. He exhibits no tenderness.  Lymphadenopathy:    He has no cervical adenopathy.  Skin: Skin is warm and dry.  Vitals reviewed.       Assessment & Plan:     1. Strep throat Strep positive. Will give amoxicillin as below. Continue allergy medications. Stay well hydrated and get plenty of rest. Call if no  symptom improvement or if symptoms worsen. - amoxicillin (AMOXIL) 875 MG tablet; Take 1 tablet (875 mg total) by mouth 2 (two) times daily.  Dispense: 20 tablet; Refill: 0  2. Generalized body aches Flu negative.  - POCT Influenza A/B  3. Sore throat Strep positive.  - POCT rapid strep A  4. Fungal infection of skin On finger. Lotrisone cream as below. Call if worsening.  - clotrimazole-betamethasone (LOTRISONE) cream; Apply 1 application topically 2 (two) times daily.  Dispense: 30 g; Refill: 0       Margaretann Loveless, PA-C  [facility]

## 2018-08-23 ENCOUNTER — Encounter: Payer: Self-pay | Admitting: Physician Assistant

## 2018-08-23 ENCOUNTER — Ambulatory Visit: Payer: BLUE CROSS/BLUE SHIELD | Admitting: Physician Assistant

## 2018-08-23 VITALS — BP 120/70 | HR 89 | Temp 98.2°F | Resp 16 | Wt 197.4 lb

## 2018-08-23 DIAGNOSIS — F39 Unspecified mood [affective] disorder: Secondary | ICD-10-CM

## 2018-08-23 DIAGNOSIS — M170 Bilateral primary osteoarthritis of knee: Secondary | ICD-10-CM | POA: Diagnosis not present

## 2018-08-23 MED ORDER — METHYLPREDNISOLONE ACETATE 80 MG/ML IJ SUSP
80.0000 mg | Freq: Once | INTRAMUSCULAR | Status: AC
  Administered 2018-08-23: 80 mg via INTRA_ARTICULAR

## 2018-08-23 MED ORDER — ARIPIPRAZOLE 5 MG PO TABS: 5.0000 mg | ORAL_TABLET | Freq: Every day | ORAL | 0 refills | Status: DC

## 2018-08-23 NOTE — Patient Instructions (Signed)
Aripiprazole tablets What is this medicine? ARIPIPRAZOLE (ay ri PIP ray zole) is an atypical antipsychotic. It is used to treat schizophrenia and bipolar disorder, also known as manic-depression. It is also used to treat Tourette's disorder and some symptoms of autism. This medicine may also be used in combination with antidepressants to treat major depressive disorder. This medicine may be used for other purposes; ask your health care provider or pharmacist if you have questions. COMMON BRAND NAME(S): Abilify What should I tell my health care provider before I take this medicine? They need to know if you have any of these conditions: -dehydration -dementia -diabetes -heart disease -history of stroke -low blood counts, like low white cell, platelet, or red cell counts -Parkinson's disease -seizures -suicidal thoughts, plans, or attempt; a previous suicide attempt by you or a family member -an unusual or allergic reaction to aripiprazole, other medicines, foods, dyes, or preservatives -pregnant or trying to get pregnant -breast-feeding How should I use this medicine? Take this medicine by mouth with a glass of water. Follow the directions on the prescription label. You can take this medicine with or without food. Take your doses at regular intervals. Do not take your medicine more often than directed. Do not stop taking except on the advice of your doctor or health care professional. A special MedGuide will be given to you by the pharmacist with each prescription and refill. Be sure to read this information carefully each time. Talk to your pediatrician regarding the use of this medicine in children. While this drug may be prescribed for children as young as 6 years of age for selected conditions, precautions do apply. Overdosage: If you think you have taken too much of this medicine contact a poison control center or emergency room at once. NOTE: This medicine is only for you. Do not share  this medicine with others. What if I miss a dose? If you miss a dose, take it as soon as you can. If it is almost time for your next dose, take only that dose. Do not take double or extra doses. What may interact with this medicine? Do not take this medicine with any of the following medications: -brexpiprazole -cisapride -dofetilide -dronedarone -metoclopramide -pimozide -thioridazine This medicine may also interact with the following medications: -alcohol -carbamazepine -certain medicines for anxiety or sleep -certain medicines for blood pressure -certain medicines for fungal infections like ketoconazole, fluconazole, posaconazole, and itraconazole -clarithromycin -fluoxetine -other medicines that prolong the QT interval (cause an abnormal heart rhythm) -paroxetine -quinidine -rifampin This list may not describe all possible interactions. Give your health care provider a list of all the medicines, herbs, non-prescription drugs, or dietary supplements you use. Also tell them if you smoke, drink alcohol, or use illegal drugs. Some items may interact with your medicine. What should I watch for while using this medicine? Visit your doctor or health care professional for regular checks on your progress. It may be several weeks before you see the full effects of this medicine. Do not suddenly stop taking this medicine. You may need to gradually reduce the dose. Patients and their families should watch out for worsening depression or thoughts of suicide. Also watch out for sudden changes in feelings such as feeling anxious, agitated, panicky, irritable, hostile, aggressive, impulsive, severely restless, overly excited and hyperactive, or not being able to sleep. If this happens, especially at the beginning of antidepressant treatment or after a change in dose, call your health care professional. You may get dizzy or drowsy. Do   not drive, use machinery, or do anything that needs mental  alertness until you know how this medicine affects you. Do not stand or sit up quickly, especially if you are an older patient. This reduces the risk of dizzy or fainting spells. Alcohol can increase dizziness and drowsiness. Avoid alcoholic drinks. This medicine can reduce the response of your body to heat or cold. Dress warm in cold weather and stay hydrated in hot weather. If possible, avoid extreme temperatures like saunas, hot tubs, very hot or cold showers, or activities that can cause dehydration such as vigorous exercise. This medicine may cause dry eyes and blurred vision. If you wear contact lenses you may feel some discomfort. Lubricating drops may help. See your eye doctor if the problem does not go away or is severe. If you notice an increased hunger or thirst, different from your normal hunger or thirst, or if you find that you have to urinate more frequently, you should contact your health care provider as soon as possible. You may need to have your blood sugar monitored. This medicine may cause changes in your blood sugar levels. You should monitor you blood sugar frequently if you have diabetes. There have been reports of uncontrollable and strong urges to gamble, binge eat, shop, and have sex while taking this medicine. If you experience any of these or other uncontrollable and strong urges while taking this medicine, you should report it to your health care provider as soon as possible. What side effects may I notice from receiving this medicine? Side effects that you should report to your doctor or health care professional as soon as possible: -allergic reactions like skin rash, itching or hives, swelling of the face, lips, or tongue -breathing problems -confusion -feeling faint or lightheaded, falls -fever or chills, sore throat -increased hunger or thirst -increased urination -joint pain -muscles pain, spasms -problems with balance, talking, walking -restlessness or need to  keep moving -seizures -suicidal thoughts or other mood changes -trouble swallowing -uncontrollable and excessive urges (examples: gambling, binge eating, shopping, having sex) -uncontrollable head, mouth, neck, arm, or leg movements -unusually weak or tired Side effects that usually do not require medical attention (report to your doctor or health care professional if they continue or are bothersome): -blurred vision -constipation -headache -nausea, vomiting -trouble sleeping -weight gain This list may not describe all possible side effects. Call your doctor for medical advice about side effects. You may report side effects to FDA at 1-800-FDA-1088. Where should I keep my medicine? Keep out of the reach of children. Store at room temperature between 15 and 30 degrees C (59 and 86 degrees F). Throw away any unused medicine after the expiration date. NOTE: This sheet is a summary. It may not cover all possible information. If you have questions about this medicine, talk to your doctor, pharmacist, or health care provider.  2018 Elsevier/Gold Standard (2016-09-03 11:45:05)  

## 2018-08-23 NOTE — Progress Notes (Signed)
Patient: Phillip Santana Male    DOB: 1971-02-26   47 y.o.   MRN: [id number] Visit Date: 08/23/2018  Today's Provider: Margaretann Loveless, PA-C   Chief Complaint  Patient presents with  . Knee Pain    cortisone injection   Subjective:    HPI Patient here today for Cortisone shot on both knees. Reports increased pain and stiffness over the last few months. Last injection was done in February 2019.   Also wants to discuss depression, mood disorder. Has been taking lamictal without relief of symptoms. He has tried lexapro, zoloft, celexa, effexor, cymbalta, wellbutrin and trintellix without symptom control.     No Known Allergies   Current Outpatient Medications:  .  B COMPLEX VITAMINS ER PO, Take by mouth., Disp: , Rfl:  .  Lysine 1000 MG TABS, Take 1 tablet by mouth daily., Disp: , Rfl:  .  Multiple Vitamin (MULTIVITAMIN) capsule, Take 1 capsule by mouth daily., Disp: , Rfl:  .  VITAMIN E PO, Take by mouth., Disp: , Rfl:  .  amoxicillin (AMOXIL) 875 MG tablet, Take 1 tablet (875 mg total) by mouth 2 (two) times daily. (Patient not taking: Reported on 08/23/2018), Disp: 20 tablet, Rfl: 0 .  azelastine (OPTIVAR) 0.05 % ophthalmic solution, Place 1 drop into both eyes 2 (two) times daily. (Patient not taking: Reported on 07/17/2018), Disp: 6 mL, Rfl: 12 .  clotrimazole-betamethasone (LOTRISONE) cream, Apply 1 application topically 2 (two) times daily. (Patient not taking: Reported on 08/23/2018), Disp: 30 g, Rfl: 0 .  fluticasone (FLONASE SENSIMIST) 27.5 MCG/SPRAY nasal spray, Place 2 sprays into the nose daily. (Patient not taking: Reported on 07/17/2018), Disp: 10 g, Rfl: 12 .  lamoTRIgine (LAMICTAL) 100 MG tablet, Take 1 tablet (100 mg total) by mouth daily. Increase to 2 tabs (200mg ) in 2 weeks if needed (Patient not taking: Reported on 08/23/2018), Disp: 60 tablet, Rfl: 0  Review of Systems  Constitutional: Positive for fatigue.  Respiratory: Negative.   Cardiovascular:  Negative.   Gastrointestinal: Negative.   Musculoskeletal: Positive for arthralgias and joint swelling.  Psychiatric/Behavioral: Positive for agitation, decreased concentration, dysphoric mood and sleep disturbance.    Social History   Tobacco Use  . Smoking status: Never Smoker  . Smokeless tobacco: Never Used  Substance Use Topics  . Alcohol use: Yes    Alcohol/week: 12.0 standard drinks    Types: 12 Cans of beer per week    Comment: occasionally   Objective:   BP 120/70 (BP Location: Left Arm, Patient Position: Sitting, Cuff Size: Large)   Pulse 89   Temp 98.2 F (36.8 C) (Oral)   Resp 16   Wt 197 lb 6.4 oz (89.5 kg)   BMI 30.92 kg/m  Vitals:   08/23/18 1608  BP: 120/70  Pulse: 89  Resp: 16  Temp: 98.2 F (36.8 C)  TempSrc: Oral  Weight: 197 lb 6.4 oz (89.5 kg)     Physical Exam  Constitutional: He appears well-developed and well-nourished. No distress.  HENT:  Head: Normocephalic and atraumatic.  Neck: Normal range of motion. Neck supple. No JVD present. No tracheal deviation present. No thyromegaly present.  Cardiovascular: Normal rate, regular rhythm and normal heart sounds. Exam reveals no gallop and no friction rub.  No murmur heard. Pulmonary/Chest: Effort normal and breath sounds normal. No respiratory distress. He has no wheezes. He has no rales.  Lymphadenopathy:    He has no cervical adenopathy.  Skin: He is not  diaphoretic.  Psychiatric: His speech is normal and behavior is normal. Judgment and thought content normal. His mood appears anxious. Cognition and memory are normal. He exhibits a depressed mood.  Vitals reviewed.      Assessment & Plan:     1. Primary osteoarthritis of both knees Steroid injections given to each knee. Tolerated well. See procedure note below.  - methylPREDNISolone acetate (DEPO-MEDROL) injection 80 mg - methylPREDNISolone acetate (DEPO-MEDROL) injection 80 mg  2. Mood disorder [facility](HCC) Patient has been without  psychiatrist for 5-6 years. 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 This caused him to not want to see psychiatry for many years. He has tried so many different medications without symptom control. He was also [facility] to [facility] but recently stopped seeing her as well due to him calling her office during one of his acute episodes and she never returned his call. He felt discouraged and decided to not return. We will stop lamictal as he has seen no improvement with that medication. Will start abilify as below. Had tried previously but was stopped due to drowsiness. However, he was also on cymbalta at that time and cymbalta also caused drowsiness. He is willing to try abilify again. This was prescribed as below. I will see him back in 4-6 weeks. Referral also placed to psychiatry for help with medication management to stabilize.  - Ambulatory referral to Psychiatry - ARIPiprazole (ABILIFY) 5 MG tablet; Take 1 tablet (5 mg total) by mouth at bedtime.  Dispense: 30 tablet; Refill: 0  Procedure note:  Benefits, risks (including infection, tattooing, adipose dimpling, and tendon rupture) and alternatives were explained to the patient. All questions were sought and answered.  Patient agreed to continue and verbal consent was obtained.   An aspiration and steroid injection was performed on the left and right knee using 4cc of 1% plain Xyloocaine and 80 mg of depo-medrol each. There was minimal bleeding. Hemostasis was intact. A dry dressing was applied. The procedure was well tolerated.       Margaretann LovelessJennifer M , PA-C  [facility]

## 2018-09-20 ENCOUNTER — Ambulatory Visit: Payer: BLUE CROSS/BLUE SHIELD | Admitting: Physician Assistant

## 2018-09-20 ENCOUNTER — Encounter: Payer: Self-pay | Admitting: Physician Assistant

## 2018-09-20 VITALS — BP 129/86 | HR 100 | Temp 98.1°F | Wt 198.4 lb

## 2018-09-20 DIAGNOSIS — F419 Anxiety disorder, unspecified: Secondary | ICD-10-CM

## 2018-09-20 DIAGNOSIS — F331 Major depressive disorder, recurrent, moderate: Secondary | ICD-10-CM

## 2018-09-20 DIAGNOSIS — E6609 Other obesity due to excess calories: Secondary | ICD-10-CM

## 2018-09-20 DIAGNOSIS — Z6831 Body mass index (BMI) 31.0-31.9, adult: Secondary | ICD-10-CM | POA: Diagnosis not present

## 2018-09-20 NOTE — Progress Notes (Signed)
Patient: Phillip Santana Male    DOB: 07/22/1971   47 y.o.   MRN: [id number] Visit Date: 09/20/2018  Today's Provider: Margaretann LovelessJennifer M Kaleb Sek, PA-C   Chief Complaint  Patient presents with  . Depression   Subjective:    HPI  Mood Disorder/ Depression Patient presents today for 1 month follow-up mood disorder. Patient was prescribed Abilify 5 mg tablets and a psychiatry referral at last visit. Patient states he just started the medication due to forgetting it at the pharmacy.    No Known Allergies   Current Outpatient Medications:  .  ARIPiprazole (ABILIFY) 5 MG tablet, Take 1 tablet (5 mg total) by mouth at bedtime., Disp: 30 tablet, Rfl: 0 .  B COMPLEX VITAMINS ER PO, Take by mouth., Disp: , Rfl:  .  Lysine 1000 MG TABS, Take 1 tablet by mouth daily., Disp: , Rfl:  .  Multiple Vitamin (MULTIVITAMIN) capsule, Take 1 capsule by mouth daily., Disp: , Rfl:  .  VITAMIN E PO, Take by mouth., Disp: , Rfl:  .  azelastine (OPTIVAR) 0.05 % ophthalmic solution, Place 1 drop into both eyes 2 (two) times daily. (Patient not taking: Reported on 07/17/2018), Disp: 6 mL, Rfl: 12  Review of Systems  Constitutional: Negative.   Respiratory: Negative.   Cardiovascular: Negative.   Neurological: Negative.   Psychiatric/Behavioral: Negative.     Social History   Tobacco Use  . Smoking status: Never Smoker  . Smokeless tobacco: Never Used  Substance Use Topics  . Alcohol use: Yes    Alcohol/week: 12.0 standard drinks    Types: 12 Cans of beer per week    Comment: occasionally      Objective:   BP 129/86 (BP Location: Left Arm, Patient Position: Sitting, Cuff Size: Large)   Pulse 100   Temp 98.1 F (36.7 C) (Oral)   Wt 198 lb 6.4 oz (90 kg)   SpO2 96%   BMI 31.07 kg/m  Vitals:   09/20/18 1543  BP: 129/86  Pulse: 100  Temp: 98.1 F (36.7 C)  TempSrc: Oral  SpO2: 96%  Weight: 198 lb 6.4 oz (90 kg)     Physical Exam Vitals signs reviewed.  Constitutional:    General: He is not in acute distress.    Appearance: He is well-developed.  HENT:     Head: Normocephalic and atraumatic.  Eyes:     Conjunctiva/sclera: Conjunctivae normal.  Neck:     Musculoskeletal: Normal range of motion and neck supple.  Pulmonary:     Effort: Pulmonary effort is normal. No respiratory distress.  Psychiatric:        Attention and Perception: Attention normal.        Mood and Affect: Mood is anxious and depressed.        Speech: Speech normal.        Behavior: Behavior normal.        Thought Content: Thought content normal.        Judgment: Judgment normal.        Assessment & Plan    1. Moderate episode of recurrent major depressive disorder (HCC) Discussed medications and compliance. Discussed counseling and psychiatry again. Reports at this time he is doing better. Will try Abilify longer since it was just started and will f/u in 4-6 weeks.   2. Anxiety See above medical treatment plan.  3. Class 1 obesity due to excess calories with serious comorbidity and body mass index (BMI) of 31.0 to  31.9 in adult Counseled patient on healthy lifestyle modifications including dieting and exercise.   I spent approximately 40 minutes with the patient today. Over 50% of this time was spent with counseling and educating the patient.     Margaretann LovelessJennifer M Brendy Ficek, PA-C  [facility]

## 2018-10-06 ENCOUNTER — Encounter: Payer: Self-pay | Admitting: Physician Assistant

## 2018-12-20 ENCOUNTER — Encounter: Payer: Self-pay | Admitting: Physician Assistant

## 2018-12-20 ENCOUNTER — Ambulatory Visit: Payer: BLUE CROSS/BLUE SHIELD | Admitting: Physician Assistant

## 2018-12-20 ENCOUNTER — Other Ambulatory Visit: Payer: Self-pay

## 2018-12-20 VITALS — BP 125/87 | HR 82 | Temp 98.2°F | Resp 16 | Wt 197.2 lb

## 2018-12-20 DIAGNOSIS — J301 Allergic rhinitis due to pollen: Secondary | ICD-10-CM

## 2018-12-20 DIAGNOSIS — M25512 Pain in left shoulder: Secondary | ICD-10-CM

## 2018-12-20 MED ORDER — ALBUTEROL SULFATE HFA 108 (90 BASE) MCG/ACT IN AERS: 2.0000 | INHALATION_SPRAY | Freq: Four times a day (QID) | RESPIRATORY_TRACT | 0 refills | Status: DC | PRN

## 2018-12-20 MED ORDER — FLUTICASONE PROPIONATE 50 MCG/ACT NA SUSP: 2.0000 | Freq: Every day | NASAL | 1 refills | Status: DC

## 2018-12-20 NOTE — Progress Notes (Signed)
Patient: Phillip Santana Male    DOB: [birth year]   48 y.o.   MRN: [id number] Visit Date: 12/20/2018  Today's Provider: Margaretann Loveless, PA-C   Chief Complaint  Patient presents with  . Shoulder Pain   Subjective:     Shoulder Pain   The pain is present in the left shoulder. This is a recurrent problem. The current episode started 1 to 4 weeks ago. There has been a history of trauma. The problem occurs constantly. The problem has been gradually worsening. The quality of the pain is described as sharp and aching. The pain is moderate. Associated symptoms include joint locking, a limited range of motion and stiffness. Pertinent negatives include no fever, inability to bear weight, itching, joint swelling, numbness or tingling. The symptoms are aggravated by activity. He has tried NSAIDS for the symptoms. The treatment provided moderate relief. His past medical history is significant for osteoarthritis. There is no history of gout or rheumatoid arthritis.    No Known Allergies   Current Outpatient Medications:  .  cholecalciferol (VITAMIN D3) 25 MCG (1000 UT) tablet, Take 1,000 Units by mouth daily., Disp: , Rfl:  .  guaiFENesin (MUCINEX PO), Take by mouth., Disp: , Rfl:  .  loratadine (CLARITIN) 10 MG tablet, Take 10 mg by mouth daily., Disp: , Rfl:  .  Lysine 1000 MG TABS, Take 1 tablet by mouth daily., Disp: , Rfl:  .  azelastine (OPTIVAR) 0.05 % ophthalmic solution, Place 1 drop into both eyes 2 (two) times daily. (Patient not taking: Reported on 07/17/2018), Disp: 6 mL, Rfl: 12 .  B COMPLEX VITAMINS ER PO, Take by mouth., Disp: , Rfl:  .  Multiple Vitamin (MULTIVITAMIN) capsule, Take 1 capsule by mouth daily., Disp: , Rfl:  .  VITAMIN E PO, Take by mouth., Disp: , Rfl:   Review of Systems  Constitutional: Negative for fever.  Respiratory: Negative.   Cardiovascular: Negative.   Musculoskeletal: Positive for arthralgias and stiffness. Negative for gout.  Skin: Negative for  itching.  Neurological: Negative for tingling and numbness.    Social History   Tobacco Use  . Smoking status: Never Smoker  . Smokeless tobacco: Never Used  Substance Use Topics  . Alcohol use: Yes    Alcohol/week: 12.0 standard drinks    Types: 12 Cans of beer per week    Comment: occasionally      Objective:   BP 125/87   Pulse 82   Temp 98.2 F (36.8 C) (Oral)   Resp 16   Wt 197 lb 3.2 oz (89.4 kg)   BMI 30.89 kg/m  Vitals:   12/20/18 1350  BP: 125/87  Pulse: 82  Resp: 16  Temp: 98.2 F (36.8 C)  TempSrc: Oral  Weight: 197 lb 3.2 oz (89.4 kg)     Physical Exam Vitals signs reviewed.  Constitutional:      General: He is not in acute distress.    Appearance: Normal appearance. He is well-developed. He is not ill-appearing or diaphoretic.  HENT:     Head: Normocephalic and atraumatic.  Neck:     Musculoskeletal: Normal range of motion and neck supple. Normal range of motion. No muscular tenderness.  Cardiovascular:     Rate and Rhythm: Normal rate and regular rhythm.     Heart sounds: Normal heart sounds. No murmur. No friction rub. No gallop.   Pulmonary:     Effort: Pulmonary effort is normal. No respiratory distress.  Breath sounds: Normal breath sounds. No wheezing or rales.  Musculoskeletal:     Left shoulder: He exhibits decreased range of motion and tenderness. He exhibits no bony tenderness, no spasm, normal pulse and normal strength.  Neurological:     Mental Status: He is alert.        Assessment & Plan    1. Acute pain of left shoulder Depo-medrol injection given into left shoulder today, see procedure note below. After care instructions printed. Avoid heavy lifting x 1 week.  - methylPREDNISolone acetate (DEPO-MEDROL) injection 40 mg  2. Seasonal allergic rhinitis due to pollen Stable. Diagnosis pulled for medication refill. Continue current medical treatment plan. - albuterol (PROVENTIL HFA;VENTOLIN HFA) 108 (90 Base) MCG/ACT  inhaler; Inhale 2 puffs into the lungs every 6 (six) hours as needed for wheezing or shortness of breath.  Dispense: 1 Inhaler; Refill: 0 - fluticasone (FLONASE) 50 MCG/ACT nasal spray; Place 2 sprays into both nostrils daily.  Dispense: 48 g; Refill: 1  Procedure Note: Benefits, risks (including infection, tattooing, adipose dimpling, and tendon rupture) and alternatives were explained to the patient. All questions were sought and answered.  Patient agreed to continue and verbal consent was obtained.   A steroid injection was performed on left shoulder using 5cc of 1% plain Xyloocaine and 40 mg of depo-medrol. This was well tolerated.    Patient seen and examined by Joycelyn Man PA-C, note scribed by Sheliah Hatch, NCMA  Margaretann Loveless, PA-C  [facility]

## 2018-12-20 NOTE — Patient Instructions (Signed)
Shoulder Injection, Care After Refer to this sheet in the next few weeks. These instructions provide you with information about caring for yourself after your procedure. Your health care provider may also give you more specific instructions. Your treatment has been planned according to current medical practices, but problems sometimes occur. Call your health care provider if you have any problems or questions after your procedure. WHAT TO EXPECT AFTER THE PROCEDURE After your procedure, it is common to have:  Soreness.  Warmth.  Swelling. You may have more pain, swelling, and warmth than you did before the injection. This reaction may last for about one day.  HOME CARE INSTRUCTIONS Bathing  If you were given a bandage (dressing), keep it dry until your health care provider says it can be removed. Ask your health care provider when you can start showering or taking a bath. Managing Pain, Stiffness, and Swelling  If directed, apply ice to the injection area:  Put ice in a plastic bag.  Place a towel between your skin and the bag.  Leave the ice on for 20 minutes, 2-3 times per day.  Do not apply heat to your shoulder.  Raise the injection area above the level of your heart while you are sitting or lying down. Activity  Avoid strenuous activities for as long as directed by your health care provider. Ask your health care provider when you can return to your normal activities. General Instructions  Take medicines only as directed by your health care provider.  Do not take aspirin or other over-the-counter medicines unless your health care provider says you can.  Check your injection site every day for signs of infection. Watch for:  Redness, swelling, or pain.  Fluid, blood, or pus.  Follow your health care provider's instructions about dressing changes and removal. SEEK MEDICAL CARE IF:  You have symptoms at your injection site that last longer than two days after your  procedure.  You have redness, swelling, or pain in your injection area.  You have fluid, blood, or pus coming from your injection site.  You have warmth in your injection area.  You have a fever.  Your pain is not controlled with medicine. SEEK IMMEDIATE MEDICAL CARE IF:  Your shoulder turns very red.  Your shoulder becomes very swollen.  Your shoulder pain is severe.   This information is not intended to replace advice given to you by your health care provider. Make sure you discuss any questions you have with your health care provider.   Document Released: 10/09/2014 Document Reviewed: 10/09/2014 Elsevier Interactive Patient Education 2016 [employer]  

## 2018-12-25 MED ORDER — METHYLPREDNISOLONE ACETATE 40 MG/ML IJ SUSP: 40.0000 mg | Freq: Once | INTRAMUSCULAR | Status: DC

## 2018-12-25 MED ORDER — METHYLPREDNISOLONE ACETATE 40 MG/ML IJ SUSP
40.0000 mg | Freq: Once | INTRAMUSCULAR | Status: AC
  Administered 2018-12-20: 40 mg via INTRA_ARTICULAR

## 2019-01-14 ENCOUNTER — Ambulatory Visit
Admission: RE | Admit: 2019-01-14 | Discharge: 2019-01-14 | Disposition: A | Discharge status: Home / Self Care | Payer: BLUE CROSS/BLUE SHIELD | Source: Ambulatory Visit | Attending: Physician Assistant | Admitting: Physician Assistant

## 2019-01-14 ENCOUNTER — Other Ambulatory Visit: Payer: Self-pay

## 2019-01-14 ENCOUNTER — Encounter: Payer: Self-pay | Admitting: Physician Assistant

## 2019-01-14 ENCOUNTER — Ambulatory Visit: Payer: BLUE CROSS/BLUE SHIELD | Admitting: Physician Assistant

## 2019-01-14 VITALS — BP 113/79 | HR 73 | Temp 98.1°F | Resp 16 | Wt 195.2 lb

## 2019-01-14 DIAGNOSIS — M7582 Other shoulder lesions, left shoulder: Secondary | ICD-10-CM

## 2019-01-14 DIAGNOSIS — K2960 Other gastritis without bleeding: Secondary | ICD-10-CM

## 2019-01-14 DIAGNOSIS — M778 Other enthesopathies, not elsewhere classified: Secondary | ICD-10-CM

## 2019-01-14 DIAGNOSIS — T39395A Adverse effect of other nonsteroidal anti-inflammatory drugs [NSAID], initial encounter: Secondary | ICD-10-CM | POA: Diagnosis not present

## 2019-01-14 DIAGNOSIS — S46012D Strain of muscle(s) and tendon(s) of the rotator cuff of left shoulder, subsequent encounter: Secondary | ICD-10-CM

## 2019-01-14 DIAGNOSIS — M25512 Pain in left shoulder: Secondary | ICD-10-CM | POA: Diagnosis not present

## 2019-01-14 MED ORDER — IBUPROFEN 800 MG PO TABS: 800.0000 mg | ORAL_TABLET | Freq: Three times a day (TID) | ORAL | 1 refills | Status: AC | PRN

## 2019-01-14 MED ORDER — OMEPRAZOLE 40 MG PO CPDR: 40.0000 mg | DELAYED_RELEASE_CAPSULE | Freq: Every day | ORAL | 3 refills | Status: DC

## 2019-01-14 NOTE — Patient Instructions (Signed)
Rotator Cuff Tendinitis  Rotator cuff tendinitis is inflammation of the tough, cord-like bands that connect muscle to bone (tendons) in the rotator cuff. The rotator cuff includes all of the muscles and tendons that connect the arm to the shoulder. The rotator cuff holds the head of the upper arm bone (humerus) in the cup (fossa) of the shoulder blade (scapula). This condition can lead to a long-lasting (chronic) tear. The tear may be partial or complete. What are the causes? This condition is usually caused by overusing the rotator cuff. What increases the risk? This condition is more likely to develop in athletes and workers who frequently use their shoulder or reach over their heads. This can include activities such as:  Tennis.  Baseball or softball.  Swimming.  Construction work.  Painting. What are the signs or symptoms? Symptoms of this condition include:  Pain spreading (radiating) from the shoulder to the upper arm.  Swelling and tenderness in front of the shoulder.  Pain when reaching, pulling, or lifting the arm above the head.  Pain when lowering the arm from above the head.  Minor pain in the shoulder when resting.  Increased pain in the shoulder at night.  Difficulty placing the arm behind the back. How is this diagnosed? This condition is diagnosed with a medical history and physical exam. Tests may also be done, including:  X-rays.  MRI.  Ultrasounds.  CT or MR arthrogram. During this test, a contrast material is injected and then images are taken. How is this treated? Treatment for this condition depends on the severity of the condition. In less severe cases, treatment may include:  Rest. This may be done with a sling that holds the shoulder still (immobilization). Your health care provider may also recommend avoiding activities that involve lifting your arm over your head.  Icing the shoulder.  Anti-inflammatory medicines, such as aspirin or  ibuprofen. In more severe cases, treatment may include:  Physical therapy.  Steroid injections.  Surgery. Follow these instructions at home: If you have a sling:  Wear the sling as told by your health care provider. Remove it only as told by your health care provider.  Loosen the sling if your fingers tingle, become numb, or turn cold and blue.  Keep the sling clean.  If the sling is not waterproof, do not let it get wet. Remove it, if allowed, or cover it with a watertight covering when you take a bath or shower. Managing pain, stiffness, and swelling  If directed, put ice on the injured area. ? If you have a removable sling, remove it as told by your health care provider. ? Put ice in a plastic bag. ? Place a towel between your skin and the bag. ? Leave the ice on for 20 minutes, 2-3 times a day.  Move your fingers often to avoid stiffness and to lessen swelling.  Raise (elevate) the injured area above the level of your heart while you are lying down.  Find a comfortable sleeping position or sleep on a recliner, if available. Driving  Do not drive or use heavy machinery while taking prescription pain medicine.  Ask your health care provider when it is safe to drive if you have a sling on your arm. Activity  Rest your shoulder as told by your health care provider.  Return to your normal activities as told by your health care provider. Ask your health care provider what activities are safe for you.  Do any exercises or stretches as   told by your health care provider.  If you do repetitive overhead tasks, take small breaks in between and include stretching exercises as told by your health care provider. General instructions  Do not use any products that contain nicotine or tobacco, such as cigarettes and e-cigarettes. These can delay healing. If you need help quitting, ask your health care provider.  Take over-the-counter and prescription medicines only as told by your  health care provider.  Keep all follow-up visits as told by your health care provider. This is important. Contact a health care provider if:  Your pain gets worse.  You have new pain in your arm, hands, or fingers.  Your pain is not relieved with medicine or does not get better after 6 weeks of treatment.  You have cracking sensations when moving your shoulder in certain directions.  You hear a snapping sound after using your shoulder, followed by severe pain and weakness. Get help right away if:  Your arm, hand, or fingers are numb or tingling.  Your arm, hand, or fingers are swollen or painful or they turn white or blue. Summary  Rotator cuff tendinitis is inflammation of the tough, cord-like bands that connect muscle to bone (tendons) in the rotator cuff.  This condition is usually caused by overusing the rotator cuff, which includes all of the muscles and tendons that connect the arm to the shoulder.  This condition is more likely to develop in athletes and workers who frequently use their shoulder or reach over their heads.  Treatment generally includes rest, anti-inflammatory medicines, and icing. In some cases, physical therapy and steroid injections may be needed. In severe cases, surgery may be needed. This information is not intended to replace advice given to you by your health care provider. Make sure you discuss any questions you have with your health care provider. Document Released: 12/09/2003 Document Revised: 09/04/2016 Document Reviewed: 09/04/2016 Elsevier Interactive Patient Education  2019 Elsevier Inc.  

## 2019-01-14 NOTE — Progress Notes (Signed)
Patient: Phillip Santana Male    DOB: [birth year]   48 y.o.   MRN: [id number] Visit Date: 01/14/2019  Today's Provider: Margaretann LovelessJennifer M Burnette, PA-C   Chief Complaint  Patient presents with  . Shoulder Pain   Subjective:     HPI  Patient here with c/o left shoulder pain. Reports that his shoulder felt better just for two days after the steroid shot he received 3 weeks ago. Reports that in the morning he is not able to lift his hand up because the shoulder is very stiff. Feels like something heavy is pressing against it. Reports that he takes up 5 IBU in the morning of 200 mg.   He has had shoulder surgery bilaterally many years ago. Also reports that his left shoulder used to dislocate/sublux very easily prior to his surgery.   No Known Allergies   Current Outpatient Medications:  .  albuterol (PROVENTIL HFA;VENTOLIN HFA) 108 (90 Base) MCG/ACT inhaler, Inhale 2 puffs into the lungs every 6 (six) hours as needed for wheezing or shortness of breath., Disp: 1 Inhaler, Rfl: 0 .  fluticasone (FLONASE) 50 MCG/ACT nasal spray, Place 2 sprays into both nostrils daily., Disp: 48 g, Rfl: 1 .  guaiFENesin (MUCINEX PO), Take by mouth., Disp: , Rfl:  .  loratadine (CLARITIN) 10 MG tablet, Take 10 mg by mouth daily., Disp: , Rfl:  .  Lysine 1000 MG TABS, Take 1 tablet by mouth daily., Disp: , Rfl:  .  azelastine (OPTIVAR) 0.05 % ophthalmic solution, Place 1 drop into both eyes 2 (two) times daily. (Patient not taking: Reported on 07/17/2018), Disp: 6 mL, Rfl: 12 .  B COMPLEX VITAMINS ER PO, Take by mouth., Disp: , Rfl:  .  cholecalciferol (VITAMIN D3) 25 MCG (1000 UT) tablet, Take 1,000 Units by mouth daily., Disp: , Rfl:  .  Multiple Vitamin (MULTIVITAMIN) capsule, Take 1 capsule by mouth daily., Disp: , Rfl:  .  VITAMIN E PO, Take by mouth., Disp: , Rfl:   Review of Systems  Constitutional: Negative.   Respiratory: Negative.   Cardiovascular: Negative.   Gastrointestinal: Negative.    Musculoskeletal: Positive for arthralgias. Negative for joint swelling, neck pain and neck stiffness.  Neurological: Positive for weakness. Negative for numbness.    Social History   Tobacco Use  . Smoking status: Never Smoker  . Smokeless tobacco: Never Used  Substance Use Topics  . Alcohol use: Yes    Alcohol/week: 12.0 standard drinks    Types: 12 Cans of beer per week    Comment: occasionally      Objective:   BP 113/79 (BP Location: Right Arm, Patient Position: Sitting, Cuff Size: Large)   Pulse 73   Temp 98.1 F (36.7 C) (Oral)   Resp 16   Wt 195 lb 3.2 oz (88.5 kg)   BMI 30.57 kg/m  Vitals:   01/14/19 1403  BP: 113/79  Pulse: 73  Resp: 16  Temp: 98.1 F (36.7 C)  TempSrc: Oral  Weight: 195 lb 3.2 oz (88.5 kg)     Physical Exam Vitals signs reviewed.  Constitutional:      General: He is not in acute distress.    Appearance: Normal appearance. He is well-developed. He is not ill-appearing or diaphoretic.  HENT:     Head: Normocephalic and atraumatic.  Neck:     Musculoskeletal: Normal range of motion and neck supple.  Cardiovascular:     Rate and Rhythm: Normal rate and regular  rhythm.     Heart sounds: Normal heart sounds. No murmur. No friction rub. No gallop.   Pulmonary:     Effort: Pulmonary effort is normal. No respiratory distress.     Breath sounds: Normal breath sounds. No wheezing or rales.  Musculoskeletal:     Left shoulder: He exhibits decreased range of motion, tenderness (decreased abduction and ER at 90 degree abd), crepitus and decreased strength. He exhibits no bony tenderness, no swelling, no pain, no spasm and normal pulse.  Neurological:     Mental Status: He is alert.         Assessment & Plan    1. 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 Also suspect some mild frozen capsulitis beginning that is restricting  motion. Patient reports he did have normal ROM after his surgery many years ago and have just recently lost the ROM. He reports that AROM movements do not cause pain "it is just a hard stop." Will refer him to orthopedics as below for further evaluation. Xray will be obtained and I will f/u pending results. IBU 800 given as below TID for inflammation. Omeprazole will be given to protect stomach as he has had gastritis in the past from NSAID use (2017). Call if worsening.  - Ambulatory referral to Orthopedic Surgery - DG Shoulder Left; Future - ibuprofen (ADVIL,MOTRIN) 800 MG tablet; Take 1 tablet (800 mg total) by mouth every 8 (eight) hours as needed.  Dispense: 90 tablet; Refill: 1  2. Capsulitis of left shoulder See above medical treatment plan. - Ambulatory referral to Orthopedic Surgery - DG Shoulder Left; Future - ibuprofen (ADVIL,MOTRIN) 800 MG tablet; Take 1 tablet (800 mg total) by mouth every 8 (eight) hours as needed.  Dispense: 90 tablet; Refill: 1  3. NSAID induced gastritis See above medical treatment plan. - omeprazole (PRILOSEC) 40 MG capsule; Take 1 capsule (40 mg total) by mouth daily.  Dispense: 90 capsule; Refill: 3     Margaretann Loveless, PA-C  [facility]

## 2019-01-16 ENCOUNTER — Telehealth: Payer: Self-pay | Admitting: Physician Assistant

## 2019-01-16 NOTE — Telephone Encounter (Signed)
This was taken care of yesterday

## 2019-01-16 NOTE — Telephone Encounter (Signed)
Please advise 

## 2019-01-16 NOTE — Telephone Encounter (Signed)
Pt called saying he wants to go with the orthopedic doctor in [place] that [facility] recommends.  He had told Boneta Lucks he wanted to use one at [facility] but he has changed   Pt's CB# [phone]  Thanks Phillip Santana

## 2019-01-27 DIAGNOSIS — M7522 Bicipital tendinitis, left shoulder: Secondary | ICD-10-CM | POA: Diagnosis not present

## 2019-01-28 DIAGNOSIS — M7522 Bicipital tendinitis, left shoulder: Secondary | ICD-10-CM | POA: Diagnosis not present

## 2019-02-04 DIAGNOSIS — M25512 Pain in left shoulder: Secondary | ICD-10-CM | POA: Diagnosis not present

## 2019-02-11 DIAGNOSIS — M7522 Bicipital tendinitis, left shoulder: Secondary | ICD-10-CM | POA: Diagnosis not present

## 2019-03-03 DIAGNOSIS — M25512 Pain in left shoulder: Secondary | ICD-10-CM | POA: Diagnosis not present

## 2019-03-13 DIAGNOSIS — M25552 Pain in left hip: Secondary | ICD-10-CM | POA: Diagnosis not present

## 2019-03-13 DIAGNOSIS — M25512 Pain in left shoulder: Secondary | ICD-10-CM | POA: Diagnosis not present

## 2019-03-21 DIAGNOSIS — M13812 Other specified arthritis, left shoulder: Secondary | ICD-10-CM | POA: Diagnosis not present

## 2019-03-28 ENCOUNTER — Other Ambulatory Visit: Payer: Self-pay

## 2019-03-28 ENCOUNTER — Ambulatory Visit: Payer: BC Managed Care – PPO | Admitting: Physician Assistant

## 2019-03-28 ENCOUNTER — Encounter: Payer: Self-pay | Admitting: Physician Assistant

## 2019-03-28 VITALS — BP 108/73 | HR 75 | Temp 98.4°F | Resp 16 | Ht 67.0 in | Wt 190.0 lb

## 2019-03-28 DIAGNOSIS — R5383 Other fatigue: Secondary | ICD-10-CM

## 2019-03-28 DIAGNOSIS — F329 Major depressive disorder, single episode, unspecified: Secondary | ICD-10-CM | POA: Diagnosis not present

## 2019-03-28 DIAGNOSIS — F331 Major depressive disorder, recurrent, moderate: Secondary | ICD-10-CM

## 2019-03-28 DIAGNOSIS — F32A Depression, unspecified: Secondary | ICD-10-CM

## 2019-03-28 MED ORDER — BUPROPION HCL ER (SR) 150 MG PO TB12: ORAL_TABLET | ORAL | 2 refills | Status: DC

## 2019-03-28 NOTE — Progress Notes (Signed)
Patient: Phillip Santana Male    DOB: 05/29/1971   48 y.o.   MRN: [id number] Visit Date: 03/28/2019  Today's Provider: Trinna Post, PA-C   Chief Complaint  Patient presents with  . Depression   Subjective:     HPI  Depression, Follow-up  He  was last seen for this 6 months ago. Changes made at last visit include patient was advised to go to counseling and psychiatry. Patient advised to continue taking Abilify and fu in 4-6 wks. Patient cites current fatigue and lack of motivation.   Long history of depression, frequently not compliant with medications and often stops taking them after two weeks. 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   Reports wellbutrin helped for one year. Then stopped working. Reports he saw a counselor, Lady Deutscher,  Twice last year. Then he tried to call and reschedule and reports he didn't get a call back. And then he didn't reschedule because he felt the counselor didn't care.    He reports poor compliance with treatment.patient reports he stopped taking soon after his visit. He was having side effects. Cannot remember what the side effect was.   He reports poor tolerance of treatment. Current symptoms include: depressed mood, fatigue, feelings of worthlessness/guilt, hopelessness and hypersomnia He feels he is Worse since last visit.  Denies suicidal and homicidal ideation. Denies plan. Cites children as protective factors.   He saw Dr. Nicolasa Ducking in psychiatry in 2014 and reports an unsatisfactory experience with her. He has not established with psychiatry since then due to this. Saw Dr. Bridgett Larsson prior to that.  Sleep study three years ago did not show sleep apnea. Patient denies significant weight gain since then.   Wt Readings from Last 3 Encounters:  03/28/19 190 lb (86.2 kg)  01/14/19 195 lb 3.2 oz (88.5 kg)  12/20/18 197 lb 3.2 oz (89.4 kg)     ------------------------------------------------------------------------    No Known Allergies   Current Outpatient Medications:  .  albuterol (PROVENTIL HFA;VENTOLIN HFA) 108 (90 Base) MCG/ACT inhaler, Inhale 2 puffs into the lungs every 6 (six) hours as needed for wheezing or shortness of breath., Disp: 1 Inhaler, Rfl: 0 .  fluticasone (FLONASE) 50 MCG/ACT nasal spray, Place 2 sprays into both nostrils daily., Disp: 48 g, Rfl: 1 .  ibuprofen (ADVIL,MOTRIN) 800 MG tablet, Take 1 tablet (800 mg total) by mouth every 8 (eight) hours as needed., Disp: 90 tablet, Rfl: 1 .  Lysine 1000 MG TABS, Take 1 tablet by mouth daily., Disp: , Rfl:  .  Multiple Vitamin (MULTIVITAMIN) capsule, Take 1 capsule by mouth daily., Disp: , Rfl:  .  omeprazole (PRILOSEC) 40 MG capsule, Take 1 capsule (40 mg total) by mouth daily. (Patient not taking: Reported on 03/28/2019), Disp: 90 capsule, Rfl: 3  Review of Systems  Constitutional: Positive for activity change and fatigue.  Psychiatric/Behavioral: Positive for sleep disturbance. The patient is nervous/anxious.     Social History   Tobacco Use  . Smoking status: Never Smoker  . Smokeless tobacco: Never Used  Substance Use Topics  . Alcohol use: Yes    Alcohol/week: 12.0 standard drinks    Types: 12 Cans of beer per week    Comment: occasionally      Objective:   BP 108/73 (BP Location: Left Arm, Patient Position: Sitting, Cuff Size: Normal)   Pulse 75   Temp 98.4 F (36.9 C) (Oral)  Resp 16   Ht 5\' 7"  (1.702 m)   Wt 190 lb (86.2 kg)   BMI 29.76 kg/m  Vitals:   03/28/19 1027  BP: 108/73  Pulse: 75  Resp: 16  Temp: 98.4 F (36.9 C)  TempSrc: Oral  Weight: 190 lb (86.2 kg)  Height: 5\' 7"  (1.702 m)     Physical Exam Constitutional:      Appearance: Normal appearance.  Cardiovascular:     Rate and Rhythm: Normal rate.  Pulmonary:     Effort: Pulmonary effort is normal.  Skin:    General: Skin is warm and dry.  Neurological:      Mental Status: He is alert and oriented to person, place, and time. Mental status is at baseline.  Psychiatric:        Mood and Affect: Mood is depressed. Mood is not anxious.      No results found for any visits on 03/28/19.     Assessment & Plan    1. Moderate episode of recurrent major depressive disorder (HCC)  Has tried numerous medications as listed in the HPI. Wellbutrin had worked best for him for about a year. Will restart this as below. Counseled that not receiving a call back to reschedule can be due to many reasons other than the counselor not caring. I have encouraged him to reschedule and have provided the number for this counselor on the after visit summary. He is agreeable to this. Denies SI/HI today.   He also cites a p  2. Depression, unspecified depression type  - Comprehensive Metabolic Panel (CMET) - CBC with Differential - TSH - buPROPion (WELLBUTRIN SR) 150 MG 12 hr tablet; Start off taking 150 mg once daily. May increase to 150 mg twice daily.  Dispense: 60 tablet; Refill: 2 - Ambulatory referral to Psychiatry  3. Other fatigue  - Testosterone,Free and Total  The entirety of the information documented in the History of Present Illness, Review of Systems and Physical Exam were personally obtained by me. Portions of this information were initially documented by Rondel BatonSulibeya Dimas, CMA and reviewed by me for thoroughness and accuracy.   F/u 6 weeks with Daiva NakayamaJenni Burnette if not established with psychiatry.     Trey SailorsAdriana M Pollak, PA-C  [facility]

## 2019-03-28 NOTE — Patient Instructions (Addendum)
Lady Deutscher [phone] - Call and schedule  Depression Screening Depression screening is a tool that your health care provider can use to learn if you have symptoms of depression. Depression is a common condition with many symptoms that are also often found in other conditions. Depression is treatable, but it must first be diagnosed. You may not know that certain feelings, thoughts, and behaviors that you are having can be symptoms of depression. Taking a depression screening test can help you and your health care provider decide if you need more assessment, or if you should be referred to a mental health care provider. What are the screening tests?  You may have a physical exam to see if another condition is affecting your mental health. You may have a blood or urine sample taken during the physical exam.  You may be interviewed using a screening tool that was developed from research, such as one of these: ? Patient Health Questionnaire (PHQ). This is a set of either 2 or 9 questions. A health care provider who has been trained to score this screening test uses a guide to assess if your symptoms suggest that you may have depression. ? Hamilton Depression Rating Scale (HAM-D). This is a set of either 17 or 24 questions. You may be asked to take it again during or after your treatment, to see if your depression has gotten better. ? Beck Depression Inventory (BDI). This is a set of 21 multiple choice questions. Your health care provider scores your answers to assess:  Your level of depression, ranging from mild to severe.  Your response to treatment.  Your health care provider may talk with you about your daily activities, such as eating, sleeping, work, and recreation, and ask if you have had any changes in activity.  Your health care provider may ask you to see a mental health specialist, such as a psychiatrist or psychologist, for more evaluation. Who should be screened for  depression?   All adults, including adults with a family history of a mental health disorder.  Adolescents who are 43-70 years old.  People who are recovering from a myocardial infarction (MI).  Pregnant women, or women who have given birth.  People who have a long-term (chronic) illness.  Anyone who has been diagnosed with another type of a mental health disorder.  Anyone who has symptoms that could show depression. What do my results mean? Your health care provider will review the results of your depression screening, physical exam, and lab tests. Positive screens suggest that you may have depression. Screening is the first step in getting the care that you may need. It is up to you to get your screening results. Ask your health care provider, or the department that is doing your screening tests, when your results will be ready. Talk with your health care provider about your results and diagnosis. A diagnosis of depression is made using the Diagnostic and Statistical Manual of Mental Disorders (DSM-V). This is a book that lists the number and type of symptoms that must be present for a health care provider to give a specific diagnosis.  Your health care provider may work with you to treat your symptoms of depression, or your health care provider may help you find a mental health provider who can assess, diagnose, and treat your depression. Get help right away if:  You have thoughts about hurting yourself or others. If you ever feel like you may hurt yourself or others, or have thoughts about  taking your own life, get help right away. You can go to your nearest emergency department or call:  Your local emergency services (911 in the [place]  A suicide crisis helpline, such as the National Suicide Prevention Lifeline at 47814874501-279 421 7037. This is open 24 hours a day. Summary  Depression screening is the first step in getting the help that you may need.  If your screening test shows  symptoms of depression (is positive), your health care provider may ask you to see a mental health provider.  Anyone who is age 47 or older should be screened for depression. This information is not intended to replace advice given to you by your health care provider. Make sure you discuss any questions you have with your health care provider. Document Released: 02/02/2017 Document Revised: 02/02/2017 Document Reviewed: 02/02/2017 Elsevier Interactive Patient Education  2019 [employer]

## 2019-03-31 ENCOUNTER — Ambulatory Visit: Payer: BLUE CROSS/BLUE SHIELD | Admitting: Physician Assistant

## 2019-05-06 ENCOUNTER — Ambulatory Visit: Payer: Self-pay | Admitting: Licensed Clinical Social Worker

## 2019-05-06 ENCOUNTER — Other Ambulatory Visit: Payer: Self-pay

## 2019-05-13 NOTE — Progress Notes (Deleted)
       Patient: Phillip Santana Male    DOB: Oct 20, 1970   48 y.o.   MRN: [id number] Visit Date: 05/13/2019  Today's Provider: Mar Daring, PA-C   No chief complaint on file.  Subjective:     HPI  Patient here to follow up Depression. Has tried numerous medications. Wellbutrin had worked best for him for about a year. Wellbutrin was prescribed for patient to restart.  No Known Allergies   Current Outpatient Medications:  .  albuterol (PROVENTIL HFA;VENTOLIN HFA) 108 (90 Base) MCG/ACT inhaler, Inhale 2 puffs into the lungs every 6 (six) hours as needed for wheezing or shortness of breath., Disp: 1 Inhaler, Rfl: 0 .  buPROPion (WELLBUTRIN SR) 150 MG 12 hr tablet, Start off taking 150 mg once daily. May increase to 150 mg twice daily., Disp: 60 tablet, Rfl: 2 .  fluticasone (FLONASE) 50 MCG/ACT nasal spray, Place 2 sprays into both nostrils daily., Disp: 48 g, Rfl: 1 .  ibuprofen (ADVIL,MOTRIN) 800 MG tablet, Take 1 tablet (800 mg total) by mouth every 8 (eight) hours as needed., Disp: 90 tablet, Rfl: 1 .  Lysine 1000 MG TABS, Take 1 tablet by mouth daily., Disp: , Rfl:  .  Multiple Vitamin (MULTIVITAMIN) capsule, Take 1 capsule by mouth daily., Disp: , Rfl:  .  omeprazole (PRILOSEC) 40 MG capsule, Take 1 capsule (40 mg total) by mouth daily. (Patient not taking: Reported on 03/28/2019), Disp: 90 capsule, Rfl: 3  Review of Systems  Social History   Tobacco Use  . Smoking status: Never Smoker  . Smokeless tobacco: Never Used  Substance Use Topics  . Alcohol use: Yes    Alcohol/week: 12.0 standard drinks    Types: 12 Cans of beer per week    Comment: occasionally      Objective:   There were no vitals taken for this visit. There were no vitals filed for this visit.   Physical Exam   No results found for any visits on 05/14/19.     Assessment & [place], PA-C  [facility]

## 2019-05-14 ENCOUNTER — Ambulatory Visit: Payer: BC Managed Care – PPO | Admitting: Physician Assistant

## 2019-05-30 ENCOUNTER — Emergency Department
Admission: EM | Admit: 2019-05-30 | Discharge: 2019-05-30 | Disposition: A | Discharge status: Home / Self Care | Payer: BC Managed Care – PPO | Attending: Emergency Medicine | Admitting: Emergency Medicine

## 2019-05-30 ENCOUNTER — Other Ambulatory Visit: Payer: Self-pay

## 2019-05-30 ENCOUNTER — Encounter: Payer: Self-pay | Admitting: *Deleted

## 2019-05-30 DIAGNOSIS — Z23 Encounter for immunization: Secondary | ICD-10-CM | POA: Diagnosis not present

## 2019-05-30 DIAGNOSIS — Y929 Unspecified place or not applicable: Secondary | ICD-10-CM | POA: Diagnosis not present

## 2019-05-30 DIAGNOSIS — Y9389 Activity, other specified: Secondary | ICD-10-CM | POA: Diagnosis not present

## 2019-05-30 DIAGNOSIS — Y999 Unspecified external cause status: Secondary | ICD-10-CM | POA: Diagnosis not present

## 2019-05-30 DIAGNOSIS — S81812A Laceration without foreign body, left lower leg, initial encounter: Secondary | ICD-10-CM | POA: Diagnosis not present

## 2019-05-30 DIAGNOSIS — Z79899 Other long term (current) drug therapy: Secondary | ICD-10-CM | POA: Diagnosis not present

## 2019-05-30 DIAGNOSIS — W25XXXA Contact with sharp glass, initial encounter: Secondary | ICD-10-CM | POA: Diagnosis not present

## 2019-05-30 MED ORDER — TETANUS-DIPHTH-ACELL PERTUSSIS 5-2.5-18.5 LF-MCG/0.5 IM SUSP
0.5000 mL | Freq: Once | INTRAMUSCULAR | Status: AC
  Administered 2019-05-30: 0.5 mL via INTRAMUSCULAR
  Filled 2019-05-30: qty 0.5

## 2019-05-30 MED ORDER — LIDOCAINE HCL (PF) 1 % IJ SOLN
10.0000 mL | Freq: Once | INTRAMUSCULAR | Status: AC
  Administered 2019-05-30: 10 mL
  Filled 2019-05-30: qty 10

## 2019-05-30 MED ORDER — HYDROCODONE-ACETAMINOPHEN 5-325 MG PO TABS: 1.0000 | ORAL_TABLET | Freq: Three times a day (TID) | ORAL | 0 refills | Status: AC | PRN

## 2019-05-30 NOTE — ED Notes (Signed)
JM provider injected lidocaine now.

## 2019-05-30 NOTE — ED Provider Notes (Signed)
[facility] Emergency Department Provider Note ____________________________________________  Time seen: 1836  I have reviewed the triage vital signs and the nursing notes.  HISTORY  Chief Complaint  Laceration  HPI Phillip Santana is a 48 y.o. male presents to the ED for evaluation of an accidental laceration to the lower leg.  Patient describes he was helping a friend move a large glass pane, when the pain slipped and fell, and apparently sliced the patient across the lower anterior shin.  He presents now with bleeding controlled for wound care.  He denies any other injury at this time.  He denies a current tetanus.  History reviewed. No pertinent past medical history.  Patient Active Problem List   Diagnosis Date Noted  . Moderate episode of recurrent major depressive disorder (HCC) 03/28/2019  . Primary osteoarthritis of left knee 05/08/2016  . Lumbar herniated disc 05/08/2016  . Hypercholesterolemia with hypertriglyceridemia 02/01/2016  . Night sweats 09/07/2015  . Depression 04/12/2015  . Alcohol abuse, episodic 03/10/2015  . Anxiety 03/10/2015  . Blood pressure elevated 03/10/2015  . Malaise and fatigue 03/03/2010  . Generalized muscle weakness 03/03/2010  . Visual disturbance 06/23/2008    Past Surgical History:  Procedure Laterality Date  . KNEE SURGERY Left 2009  . ROTATOR CUFF REPAIR Right 2005  . SHOULDER SURGERY    . TESTICLE TORSION REDUCTION  1985    Prior to Admission medications   Medication Sig Start Date End Date Taking? Authorizing Provider  albuterol (PROVENTIL HFA;VENTOLIN HFA) 108 (90 Base) MCG/ACT inhaler Inhale 2 puffs into the lungs every 6 (six) hours as needed for wheezing or shortness of breath. 12/20/18   Rosezetta Schlatter, Alessandra Bevels, PA-C  buPROPion (WELLBUTRIN SR) 150 MG 12 hr tablet Start off taking 150 mg once daily. May increase to 150 mg twice daily. 03/28/19   Trey Sailors, PA-C  fluticasone (FLONASE) 50 MCG/ACT nasal  spray Place 2 sprays into both nostrils daily. 12/20/18   Margaretann Loveless, PA-C  ibuprofen (ADVIL,MOTRIN) 800 MG tablet Take 1 tablet (800 mg total) by mouth every 8 (eight) hours as needed. 01/14/19   Margaretann Loveless, PA-C  Lysine 1000 MG TABS Take 1 tablet by mouth daily.    [provider]  Multiple Vitamin (MULTIVITAMIN) capsule Take 1 capsule by mouth daily.    [provider]  omeprazole (PRILOSEC) 40 MG capsule Take 1 capsule (40 mg total) by mouth daily. Patient not taking: Reported on 03/28/2019 01/14/19   Margaretann Loveless, PA-C    Allergies Patient has no known allergies.  Family History  Problem Relation Age of Onset  . Headache Mother   . Bipolar disorder Father   . Dementia Paternal Grandmother   . Coronary artery disease Paternal Grandfather     Social History Social History   Tobacco Use  . Smoking status: Never Smoker  . Smokeless tobacco: Never Used  Substance Use Topics  . Alcohol use: Yes    Alcohol/week: 12.0 standard drinks    Types: 12 Cans of beer per week    Comment: occasionally  . Drug use: No    Review of Systems  Constitutional: Negative for fever. Eyes: Negative for visual changes. ENT: Negative for sore throat. Cardiovascular: Negative for chest pain. Respiratory: Negative for shortness of breath. Gastrointestinal: Negative for abdominal pain, vomiting and diarrhea. Genitourinary: Negative for dysuria. Musculoskeletal: Negative for back pain. Skin: Negative for rash. LLE laceration as above. Neurological: Negative for headaches, focal weakness or numbness. ____________________________________________  PHYSICAL EXAM:  VITAL SIGNS: ED Triage Vitals  Enc Vitals Group     BP 05/30/19 1811 122/84     Pulse Rate 05/30/19 1811 80     Resp 05/30/19 1811 18     Temp 05/30/19 1811 98.1 F (36.7 C)     Temp Source 05/30/19 1811 Oral     SpO2 05/30/19 1811 97 %     Weight --      Height --      Head  Circumference --      Peak Flow --      Pain Score 05/30/19 1820 5     Pain Loc --      Pain Edu? --      Excl. in [place]? --     Constitutional: Alert and oriented. Well appearing and in no distress. GCS = 15 Head: Normocephalic and atraumatic. Eyes: Conjunctivae are normal. Normal extraocular movements Cardiovascular: Normal rate, regular rhythm. Normal distal pulses. No CCE distally Respiratory: Normal respiratory effort.  Musculoskeletal: Nontender with normal range of motion in all extremities.  Neurologic: Left lower extremity with a anterior lower leg laceration noted.  The semilunar laceration to the lower leg exposes the subcutaneous fat.  The wound measures approximately 10 cm from into and, and is irregular as it progresses from shallow to deep distally.  No active bleeding is appreciated at this time.  Normal gait without ataxia. Normal speech and language. No gross focal neurologic deficits are appreciated. Skin:  Skin is warm, dry and intact. No rash noted. Psychiatric: Mood and affect are normal. Patient exhibits appropriate insight and judgment. ____________________________________________  PROCEDURES  Tdap 0.5 ml IM  .Marland KitchenLaceration Repair  Date/Time: 05/30/2019 7:12 PM Performed by: Melvenia Needles, PA-C Authorized by: Melvenia Needles, PA-C   Consent:    Consent obtained:  Verbal   Consent given by:  Patient   Risks discussed:  Pain and poor wound healing Anesthesia (see MAR for exact dosages):    Anesthesia method:  Local infiltration   Local anesthetic:  Lidocaine 1% w/o epi Laceration details:    Location:  Leg   Leg location:  L lower leg   Length (cm):  10   Depth (mm):  5 Repair type:    Repair type:  Intermediate Pre-procedure details:    Preparation:  Patient was prepped and draped in usual sterile fashion Exploration:    Contaminated: no   Treatment:    Area cleansed with:  Betadine and saline   Amount of cleaning:  Standard    Irrigation solution:  Sterile saline   Irrigation method:  Syringe Subcutaneous repair:    Suture size:  4-0   Suture material:  Vicryl   Suture technique:  Running   Number of sutures:  1 Skin repair:    Repair method:  Sutures   Suture size:  3-0   Suture material:  Nylon   Suture technique:  Simple interrupted and horizontal mattress   Number of sutures:  9 Approximation:    Approximation:  Close Post-procedure details:    Dressing:  Non-adherent dressing, tube gauze and bulky dressing   Patient tolerance of procedure:  Tolerated well, no immediate complications  ____________________________________________  INITIAL IMPRESSION / ASSESSMENT AND PLAN / ED COURSE  Ediberto Sens Kellen was evaluated in Emergency Department on 05/30/2019 for the symptoms described in the history of present illness. He was evaluated in the context of the global COVID-19 pandemic, which necessitated consideration that the patient might  be at risk for infection with the SARS-CoV-2 virus that causes COVID-19. Institutional protocols and algorithms that pertain to the evaluation of patients at risk for COVID-19 are in a state of rapid change based on information released by regulatory bodies including the CDC and federal and state organizations. These policies and algorithms were followed during the patient's care in the ED.  Patient with ED evaluation and management of a LLE laceration. Suture repair provided good wound edge coaptation. Wound care instructions and supplies provided to the patient and his wife. He will see the PCP in 10-14 days for suture removal.  ____________________________________________  FINAL CLINICAL IMPRESSION(S) / ED DIAGNOSES  Final diagnoses:  Leg laceration, left, initial encounter      Lissa HoardMenshew, Jahvier Aldea V Bacon, PA-C 05/30/19 1947    Concha SeFunke, Mary E, MD 06/01/19 1536

## 2019-05-30 NOTE — ED Notes (Signed)
Pt verbalized understanding of discharge instructions. NAD at this time. 

## 2019-05-30 NOTE — Discharge Instructions (Signed)
You have had your deep leg laceration repaired using sutures. Keep the wound clean, dry, and covered. Rest with the leg elevated when seated. Follow-up with your provider in 10-14 days for suture removal.

## 2019-05-30 NOTE — ED Notes (Signed)
Large lac to pt's LL leg. Pt can move foot and wiggle toes. Bleeding controlled with non-adherent/gauze dressing; site clean.

## 2019-05-30 NOTE — ED Triage Notes (Signed)
Patient report approximately 45 minutes prior to arrival, patient picked up a large piece of glass that lacerated his left shin. Area is wrapped with a jacket. Patient has a large gaping wound that is bleeding freely to left lower shin. Area rewrapped.

## 2019-06-11 ENCOUNTER — Ambulatory Visit: Payer: BC Managed Care – PPO | Admitting: Physician Assistant

## 2019-06-11 ENCOUNTER — Telehealth: Payer: Self-pay | Admitting: Physician Assistant

## 2019-06-11 ENCOUNTER — Encounter: Payer: Self-pay | Admitting: Physician Assistant

## 2019-06-11 ENCOUNTER — Other Ambulatory Visit: Payer: Self-pay

## 2019-06-11 ENCOUNTER — Ambulatory Visit: Payer: Self-pay

## 2019-06-11 VITALS — BP 122/70 | HR 66 | Temp 96.9°F | Resp 16 | Wt 187.0 lb

## 2019-06-11 VITALS — BP 110/80 | HR 75 | Temp 97.3°F | Resp 16

## 2019-06-11 DIAGNOSIS — Z4802 Encounter for removal of sutures: Secondary | ICD-10-CM

## 2019-06-11 DIAGNOSIS — F32A Depression, unspecified: Secondary | ICD-10-CM

## 2019-06-11 DIAGNOSIS — F329 Major depressive disorder, single episode, unspecified: Secondary | ICD-10-CM

## 2019-06-11 DIAGNOSIS — Z4800 Encounter for change or removal of nonsurgical wound dressing: Secondary | ICD-10-CM

## 2019-06-11 MED ORDER — BACITRACIN-NEOMYCIN-POLYMYXIN 400-5-5000 EX OINT: TOPICAL_OINTMENT | Freq: Once | CUTANEOUS | Status: DC

## 2019-06-11 NOTE — Progress Notes (Signed)
   Subjective:    Patient ID: Phillip Santana, male    DOB: 1971-05-14, 48 y.o.   MRN: [id number]  Patient presents to clinic with concerns related to LEFT leg wound. LLL noted to be warm to touch, mild swelling noted to ankle. Pulses noted be +3. No drainage or odor noted. Site cleansed with normal saline and antibiotic oinment applied; covered with nonadherent gauze, kerlix. Patient has F/U at 1:40p today.     Review of Systems  All other systems reviewed and are negative.      Objective:   Physical Exam Vitals signs and nursing note reviewed.     Assessment & Plan:   Wt Readings from Last 3 Encounters:  03/28/19 190 lb (86.2 kg)  01/14/19 195 lb 3.2 oz (88.5 kg)  12/20/18 197 lb 3.2 oz (89.4 kg)   Temp Readings from Last 3 Encounters:  06/11/19 (!) 97.3 F (36.3 C) (Temporal)  05/30/19 98.1 F (36.7 C)  03/28/19 98.4 F (36.9 C) (Oral)   BP Readings from Last 3 Encounters:  06/11/19 110/80  05/30/19 122/73  03/28/19 108/73   Pulse Readings from Last 3 Encounters:  06/11/19 75  05/30/19 82  03/28/19 75     No results found for: POCGLU         Thank you!!  [place] Nurse Specialist [place]: [phone]  Cell:  [phone] Website: [email or web address]

## 2019-06-11 NOTE — Telephone Encounter (Signed)
Still had some questions about his symptoms from today's visit. He is needing to know if he needed an antibiotic.  Please call pt back asap.  Thanks, [employer]

## 2019-06-11 NOTE — Patient Instructions (Signed)
Wound Care, Adult Taking care of your wound properly can help to prevent pain, infection, and scarring. It can also help your wound to heal more quickly. How to care for your wound Wound care      Follow instructions from your health care provider about how to take care of your wound. Make sure you: ? Wash your hands with soap and water before you change the bandage (dressing). If soap and water are not available, use hand sanitizer. ? Change your dressing as told by your health care provider. ? Leave stitches (sutures), skin glue, or adhesive strips in place. These skin closures may need to stay in place for 2 weeks or longer. If adhesive strip edges start to loosen and curl up, you may trim the loose edges. Do not remove adhesive strips completely unless your health care provider tells you to do that.  Check your wound area every day for signs of infection. Check for: ? Redness, swelling, or pain. ? Fluid or blood. ? Warmth. ? Pus or a bad smell.  Ask your health care provider if you should clean the wound with mild soap and water. Doing this may include: ? Using a clean towel to pat the wound dry after cleaning it. Do not rub or scrub the wound. ? Applying a cream or ointment. Do this only as told by your health care provider. ? Covering the incision with a clean dressing.  Ask your health care provider when you can leave the wound uncovered.  Keep the dressing dry until your health care provider says it can be removed. Do not take baths, swim, use a hot tub, or do anything that would put the wound underwater until your health care provider approves. Ask your health care provider if you can take showers. You may only be allowed to take sponge baths. Medicines   If you were prescribed an antibiotic medicine, cream, or ointment, take or use the antibiotic as told by your health care provider. Do not stop taking or using the antibiotic even if your condition improves.  Take  over-the-counter and prescription medicines only as told by your health care provider. If you were prescribed pain medicine, take it 30 or more minutes before you do any wound care or as told by your health care provider. General instructions  Return to your normal activities as told by your health care provider. Ask your health care provider what activities are safe.  Do not scratch or pick at the wound.  Do not use any products that contain nicotine or tobacco, such as cigarettes and e-cigarettes. These may delay wound healing. If you need help quitting, ask your health care provider.  Keep all follow-up visits as told by your health care provider. This is important.  Eat a diet that includes protein, vitamin A, vitamin C, and other nutrient-rich foods to help the wound heal. ? Foods rich in protein include meat, dairy, beans, nuts, and other sources. ? Foods rich in vitamin A include carrots and dark green, leafy vegetables. ? Foods rich in vitamin C include citrus, tomatoes, and other fruits and vegetables. ? Nutrient-rich foods have protein, carbohydrates, fat, vitamins, or minerals. Eat a variety of healthy foods including vegetables, fruits, and whole grains. Contact a health care provider if:  You received a tetanus shot and you have swelling, severe pain, redness, or bleeding at the injection site.  Your pain is not controlled with medicine.  You have redness, swelling, or pain around the wound.    You have fluid or blood coming from the wound.  Your wound feels warm to the touch.  You have pus or a bad smell coming from the wound.  You have a fever or chills.  You are nauseous or you vomit.  You are dizzy. Get help right away if:  You have a red streak going away from your wound.  The edges of the wound open up and separate.  Your wound is bleeding, and the bleeding does not stop with gentle pressure.  You have a rash.  You faint.  You have trouble breathing.  Summary  Always wash your hands with soap and water before changing your bandage (dressing).  To help with healing, eat foods that are rich in protein, vitamin A, vitamin C, and other nutrients.  Check your wound every day for signs of infection. Contact your health care provider if you suspect that your wound is infected. This information is not intended to replace advice given to you by your health care provider. Make sure you discuss any questions you have with your health care provider. Document Released: 06/27/2008 Document Revised: 01/06/2019 Document Reviewed: 04/04/2016 Elsevier Patient Education  2020 Elsevier Inc.  

## 2019-06-11 NOTE — Progress Notes (Signed)
Patient: Phillip Santana Male    DOB: Apr 09, 1971   48 y.o.   MRN: [id number] Visit Date: 06/11/2019  Today's Provider: Trinna Post, PA-C   Chief Complaint  Patient presents with  . Follow-up   Subjective:     HPI   Follow up ER visit  Patient was seen in ER for leg laceration on 05/30/2019. He was treated for leg laceration. Treatment for this included 9 sutures in left lower leg. He reports good compliance with treatment. He reports this condition is Improved. Patient reports feeling weak and having more sweats lately.   Patient reports feeling weak and fatigued. Labwork ordered previously included CBC, CMET, TSH and testosterone. Patient has not gotten this, reports it is difficult to get off work.   Additionally, he was referred to psychiatry during last visit 03/2019. He reports somebody reached out to him but that the appointment was one month after he was seen here and that was very far away so he didn't show up.   Previous Treatment:   Long history of depression, frequently not compliant with medications and often stops taking them after two weeks. 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   Reports wellbutrin helped for one year. Then stopped working. Reports he saw a counselor, Lady Deutscher,  Twice last year. Then he tried to call and reschedule and reports he didn't get a call back. And then he didn't reschedule because he felt the counselor didn't care.               He reports poor compliance with treatment.patient reports he stopped taking soon after his visit. He was having side effects. Cannot remember what the side effect was.   He saw Dr. Nicolasa Ducking in psychiatry in 2014 and reports an unsatisfactory experience with her. He has not established with psychiatry since then due to this. Saw Dr. Bridgett Larsson prior to that.  Sleep study three years ago did not show sleep apnea. Patient denies  significant weight gain since then.   ------------------------------------------------------------------------------------   No Known Allergies   Current Outpatient Medications:  .  albuterol (PROVENTIL HFA;VENTOLIN HFA) 108 (90 Base) MCG/ACT inhaler, Inhale 2 puffs into the lungs every 6 (six) hours as needed for wheezing or shortness of breath., Disp: 1 Inhaler, Rfl: 0 .  buPROPion (WELLBUTRIN SR) 150 MG 12 hr tablet, Start off taking 150 mg once daily. May increase to 150 mg twice daily., Disp: 60 tablet, Rfl: 2 .  fluticasone (FLONASE) 50 MCG/ACT nasal spray, Place 2 sprays into both nostrils daily., Disp: 48 g, Rfl: 1 .  ibuprofen (ADVIL,MOTRIN) 800 MG tablet, Take 1 tablet (800 mg total) by mouth every 8 (eight) hours as needed., Disp: 90 tablet, Rfl: 1 .  Lysine 1000 MG TABS, Take 1 tablet by mouth daily., Disp: , Rfl:  .  Multiple Vitamin (MULTIVITAMIN) capsule, Take 1 capsule by mouth daily., Disp: , Rfl:  .  omeprazole (PRILOSEC) 40 MG capsule, Take 1 capsule (40 mg total) by mouth daily. (Patient not taking: Reported on 03/28/2019), Disp: 90 capsule, Rfl: 3  Current Facility-Administered Medications:  .  neomycin-bacitracin-polymyxin (NEOSPORIN) ointment packet, , Topical, Once, Jerrol Banana., MD  Review of Systems  Constitutional: Positive for diaphoresis and fatigue. Negative for appetite change, chills and fever.  Respiratory: Negative for chest tightness, shortness of breath and wheezing.   Cardiovascular: Negative for chest pain and palpitations.  Gastrointestinal: Negative  for abdominal pain, nausea and vomiting.  Skin: Positive for wound.  Neurological: Positive for weakness.    Social History   Tobacco Use  . Smoking status: Never Smoker  . Smokeless tobacco: Never Used  Substance Use Topics  . Alcohol use: Yes    Alcohol/week: 12.0 standard drinks    Types: 12 Cans of beer per week    Comment: occasionally      Objective:   BP 122/70 (BP  Location: Right Arm, Patient Position: Sitting, Cuff Size: Large)   Pulse 66   Temp (!) 96.9 F (36.1 C) (Temporal)   Resp 16   Wt 187 lb (84.8 kg)   SpO2 95% Comment: room air  BMI 29.29 kg/m  Vitals:   06/11/19 1352  BP: 122/70  Pulse: 66  Resp: 16  Temp: (!) 96.9 F (36.1 C)  TempSrc: Temporal  SpO2: 95%  Weight: 187 lb (84.8 kg)  Body mass index is 29.29 kg/m.   Physical Exam Musculoskeletal:       Legs:  Skin:    General: Skin is warm and dry.  Neurological:     Mental Status: He is oriented to person, place, and time. Mental status is at baseline.  Psychiatric:        Mood and Affect: Mood normal.        Behavior: Behavior normal.      No results found for any visits on 06/11/19.     Assessment & Plan    1. Visit for suture removal  9 of 9 sutures removed. Laceration is well approximated and healing, no bleeding or signs of infection noted. May use scar cream.   2. Depression, unspecified depression type  Ongoing issue. Suspect his fatigue is related to this. Counseled testosterone needs to be drawn between 8-10 am, have written him work note to get this. He did not follow up with psychiatry because the appointment was too far away from his initial appointment. Non-compliance is a typical issue for him. He may do what he feels is best, he has been counseled regarding this repeatedly.   The entirety of the information documented in the History of Present Illness, Review of Systems and Physical Exam were personally obtained by me. Portions of this information were initially documented by Awilda Billoshena Chambers, CMA and reviewed by me for thoroughness and accuracy.   I have spent 25 minutes with this patient, >50% of which was spent on counseling and coordination of care.      Trey SailorsAdriana M Aahana Elza, PA-C  [facility]

## 2019-06-11 NOTE — Patient Instructions (Signed)

## 2019-06-12 ENCOUNTER — Inpatient Hospital Stay: Payer: Self-pay | Admitting: Physician Assistant

## 2019-06-12 NOTE — Telephone Encounter (Signed)
Pt advised.  He wants to see Tawanna Sat for back pain/burning.  Apt made for 091/15/2020 @  2:20.   Thanks,   -Mickel Baas

## 2019-06-12 NOTE — Telephone Encounter (Signed)
No he does not need an antibiotic. I explicitly told him during the office visit that it is well healing and is not infected.

## 2019-06-12 NOTE — Telephone Encounter (Signed)
Please advise 

## 2019-06-12 NOTE — Telephone Encounter (Signed)
Phillip Santana saw him yesterday. I did not get to visualize the wound.

## 2019-06-13 ENCOUNTER — Other Ambulatory Visit: Payer: Self-pay | Admitting: Physician Assistant

## 2019-06-13 DIAGNOSIS — R5383 Other fatigue: Secondary | ICD-10-CM | POA: Diagnosis not present

## 2019-06-13 DIAGNOSIS — F329 Major depressive disorder, single episode, unspecified: Secondary | ICD-10-CM | POA: Diagnosis not present

## 2019-06-16 LAB — COMPREHENSIVE METABOLIC PANEL
ALT: 28 IU/L (ref 0–44)
AST: 29 IU/L (ref 0–40)
Albumin/Globulin Ratio: 1.5 (ref 1.2–2.2)
Albumin: 4.6 g/dL (ref 4.0–5.0)
Alkaline Phosphatase: 88 IU/L (ref 39–117)
BUN/Creatinine Ratio: 14 (ref 9–20)
BUN: 17 mg/dL (ref 6–24)
Bilirubin Total: 0.3 mg/dL (ref 0.0–1.2)
CO2: 20 mmol/L (ref 20–29)
Calcium: 9.7 mg/dL (ref 8.7–10.2)
Chloride: 104 mmol/L (ref 96–106)
Creatinine, Ser: 1.21 mg/dL (ref 0.76–1.27)
GFR calc Af Amer: 82 mL/min/{1.73_m2} (ref >59)
GFR calc non Af Amer: 71 mL/min/{1.73_m2} (ref >59)
Globulin, Total: 3 g/dL (ref 1.5–4.5)
Glucose: 118 mg/dL — ABNORMAL HIGH (ref 65–99)
Potassium: 4.3 mmol/L (ref 3.5–5.2)
Sodium: 140 mmol/L (ref 134–144)
Total Protein: 7.6 g/dL (ref 6.0–8.5)

## 2019-06-16 LAB — CBC WITH DIFFERENTIAL/PLATELET
Basophils Absolute: 0 10*3/uL (ref 0.0–0.2)
Basos: 1 %
EOS (ABSOLUTE): 0.1 10*3/uL (ref 0.0–0.4)
Eos: 2 %
Hematocrit: 47.9 % (ref 37.5–51.0)
Hemoglobin: 15.9 g/dL (ref 13.0–17.7)
Immature Grans (Abs): 0 10*3/uL (ref 0.0–0.1)
Immature Granulocytes: 0 %
Lymphocytes Absolute: 0.9 10*3/uL (ref 0.7–3.1)
Lymphs: 21 %
MCH: 29.3 pg (ref 26.6–33.0)
MCHC: 33.2 g/dL (ref 31.5–35.7)
MCV: 88 fL (ref 79–97)
Monocytes Absolute: 0.6 10*3/uL (ref 0.1–0.9)
Monocytes: 14 %
Neutrophils Absolute: 2.7 10*3/uL (ref 1.4–7.0)
Neutrophils: 62 %
Platelets: 208 10*3/uL (ref 150–450)
RBC: 5.42 x10E6/uL (ref 4.14–5.80)
RDW: 12.4 % (ref 11.6–15.4)
WBC: 4.4 10*3/uL (ref 3.4–10.8)

## 2019-06-16 LAB — TESTOSTERONE,FREE AND TOTAL
Testosterone, Free: 3.9 pg/mL — ABNORMAL LOW (ref 6.8–21.5)
Testosterone: 266 ng/dL (ref 264–916)

## 2019-06-16 LAB — TSH: TSH: 0.929 u[IU]/mL (ref 0.450–4.500)

## 2019-06-16 NOTE — Progress Notes (Signed)
Patient: Phillip Santana Male    DOB: 06-22-1971   48 y.o.   MRN: [id number] Visit Date: 06/17/2019  Today's Provider: Margaretann LovelessJennifer M Jame Seelig, PA-C   No chief complaint on file.  Subjective:     HPI   Patient states he is here to discuss his mental health issues with pcp. He reports he has started to escalate again and is having depression symptoms and anxiety. He reports that family members are having to start anxiety medications due to him and he hates that. He also reports having SI Sunday two weeks ago after him and his brother got into an argument. He is still able to contract for safety thanks to his children. He states he could never do that and leave his children. They are his lifeline to staying on top of things.   No Known Allergies   Current Outpatient Medications:  .  albuterol (PROVENTIL HFA;VENTOLIN HFA) 108 (90 Base) MCG/ACT inhaler, Inhale 2 puffs into the lungs every 6 (six) hours as needed for wheezing or shortness of breath., Disp: 1 Inhaler, Rfl: 0 .  buPROPion (WELLBUTRIN SR) 150 MG 12 hr tablet, Start off taking 150 mg once daily. May increase to 150 mg twice daily., Disp: 60 tablet, Rfl: 2 .  fluticasone (FLONASE) 50 MCG/ACT nasal spray, Place 2 sprays into both nostrils daily., Disp: 48 g, Rfl: 1 .  ibuprofen (ADVIL,MOTRIN) 800 MG tablet, Take 1 tablet (800 mg total) by mouth every 8 (eight) hours as needed., Disp: 90 tablet, Rfl: 1 .  Lysine 1000 MG TABS, Take 1 tablet by mouth daily., Disp: , Rfl:  .  Multiple Vitamin (MULTIVITAMIN) capsule, Take 1 capsule by mouth daily., Disp: , Rfl:  .  omeprazole (PRILOSEC) 40 MG capsule, Take 1 capsule (40 mg total) by mouth daily. (Patient not taking: Reported on 03/28/2019), Disp: 90 capsule, Rfl: 3  Current Facility-Administered Medications:  .  neomycin-bacitracin-polymyxin (NEOSPORIN) ointment packet, , Topical, Once, Maple HudsonGilbert, Richard L Jr., MD  Review of Systems  Constitutional: Negative for appetite change, chills  and fever.  Respiratory: Negative for chest tightness, shortness of breath and wheezing.   Cardiovascular: Negative for chest pain and palpitations.  Gastrointestinal: Negative for abdominal pain, nausea and vomiting.  Musculoskeletal: Negative for back pain and myalgias.  Psychiatric/Behavioral: Positive for agitation, dysphoric mood, sleep disturbance and suicidal ideas. Negative for self-injury. The patient is nervous/anxious.     Social History   Tobacco Use  . Smoking status: Never Smoker  . Smokeless tobacco: Never Used  Substance Use Topics  . Alcohol use: Yes    Alcohol/week: 12.0 standard drinks    Types: 12 Cans of beer per week    Comment: occasionally      Objective:   BP 109/77 (BP Location: Right Arm, Patient Position: Sitting, Cuff Size: Large)   Pulse 78   Temp 97.7 F (36.5 C) (Other (Comment))   Resp 16   Ht 5\' 7"  (1.702 m)   Wt 185 lb (83.9 kg)   SpO2 97%   BMI 28.98 kg/m  Vitals:   06/17/19 1441  BP: 109/77  Pulse: 78  Resp: 16  Temp: 97.7 F (36.5 C)  TempSrc: Other (Comment)  SpO2: 97%  Weight: 185 lb (83.9 kg)  Height: 5\' 7"  (1.702 m)  Body mass index is 28.98 kg/m.   Physical Exam Vitals signs reviewed.  Constitutional:      General: He is not in acute distress.    Appearance: Normal  appearance. He is well-developed. He is not ill-appearing or diaphoretic.  HENT:     Head: Normocephalic and atraumatic.  Neck:     Musculoskeletal: Normal range of motion and neck supple.  Cardiovascular:     Rate and Rhythm: Normal rate and regular rhythm.     Heart sounds: Normal heart sounds. No murmur. No friction rub. No gallop.   Pulmonary:     Effort: Pulmonary effort is normal. No respiratory distress.     Breath sounds: Normal breath sounds. No wheezing or rales.  Neurological:     Mental Status: He is alert.  Psychiatric:        Attention and Perception: Attention and perception normal.        Mood and Affect: Mood is depressed. Affect  is flat.        Speech: Speech normal.        Behavior: Behavior normal. Behavior is cooperative.        Thought Content: Thought content includes suicidal (two sundays ago 06/01/19) ideation. Thought content does not include homicidal or suicidal plan.        Cognition and Memory: Cognition and memory normal.        Judgment: Judgment normal.      No results found for any visits on 06/17/19.     Assessment & Plan    1. Depression, major, single episode, moderate ([place]) Had success with Pristiq in the past but was too expensive. Now seems to be covered by insurance. Will restart Pristiq and stop Wellbutrin. He has been referred to counseling and reports he received one phone call but he was at work, so he is planning to call them back. I will see him in 4 weeks for f/u.  - desvenlafaxine (PRISTIQ) 100 MG 24 hr tablet; Take 1 tablet (100 mg total) by mouth daily.  Dispense: 90 tablet; Refill: 1  2. Anxiety Starting alprazolam for acute anxiety. Advised of abuse potential. Will f/u in 4 weeks to see how many he is using and if effective.  - ALPRAZolam (XANAX) 0.5 MG tablet; Take 1 tablet (0.5 mg total) by mouth 2 (two) times daily as needed for anxiety.  Dispense: 60 tablet; Refill: 5  3. Seasonal allergies Failed flonase, reports makes him feel more congested. Will try astelin nasal spray as below. Advised to avoid alcohol consumption with nasal spray.  - azelastine (ASTELIN) 0.1 % nasal spray; Place 2 sprays into both nostrils 2 (two) times daily. Use in each nostril as directed  Dispense: 30 mL; Refill: 12  4. Hypotestosteronism Found on labs most recently. Will recheck and address if second reading is low as well.  - Testosterone,Free and Total     Mar Daring, PA-C  [facility]

## 2019-06-17 ENCOUNTER — Encounter: Payer: Self-pay | Admitting: Physician Assistant

## 2019-06-17 ENCOUNTER — Telehealth: Payer: Self-pay | Admitting: Physician Assistant

## 2019-06-17 ENCOUNTER — Ambulatory Visit: Payer: BC Managed Care – PPO | Admitting: Physician Assistant

## 2019-06-17 ENCOUNTER — Other Ambulatory Visit: Payer: Self-pay

## 2019-06-17 VITALS — BP 109/77 | HR 78 | Temp 97.7°F | Resp 16 | Ht 67.0 in | Wt 185.0 lb

## 2019-06-17 DIAGNOSIS — F321 Major depressive disorder, single episode, moderate: Secondary | ICD-10-CM

## 2019-06-17 DIAGNOSIS — F419 Anxiety disorder, unspecified: Secondary | ICD-10-CM

## 2019-06-17 DIAGNOSIS — J302 Other seasonal allergic rhinitis: Secondary | ICD-10-CM | POA: Diagnosis not present

## 2019-06-17 DIAGNOSIS — E349 Endocrine disorder, unspecified: Secondary | ICD-10-CM | POA: Diagnosis not present

## 2019-06-17 MED ORDER — DESVENLAFAXINE SUCCINATE ER 100 MG PO TB24: 100.0000 mg | ORAL_TABLET | Freq: Every day | ORAL | 1 refills | Status: DC

## 2019-06-17 MED ORDER — AZELASTINE HCL 0.1 % NA SOLN: 2.0000 | Freq: Two times a day (BID) | NASAL | 12 refills | Status: AC

## 2019-06-17 MED ORDER — ALPRAZOLAM 0.5 MG PO TABS: 0.5000 mg | ORAL_TABLET | Freq: Two times a day (BID) | ORAL | 5 refills | Status: DC | PRN

## 2019-06-17 NOTE — Patient Instructions (Signed)
Alprazolam tablets What is this medicine? ALPRAZOLAM (al PRAY zoe lam) is a benzodiazepine. It is used to treat anxiety and panic attacks. This medicine may be used for other purposes; ask your health care provider or pharmacist if you have questions. COMMON BRAND NAME(S): Xanax What should I tell my health care provider before I take this medicine? They need to know if you have any of these conditions:  an alcohol or drug abuse problem  bipolar disorder, depression, psychosis or other mental health conditions  glaucoma  kidney or liver disease  lung or breathing disease  myasthenia gravis  Parkinson's disease  porphyria  seizures or a history of seizures  suicidal thoughts  an unusual or allergic reaction to alprazolam, other benzodiazepines, foods, dyes, or preservatives  pregnant or trying to get pregnant  breast-feeding How should I use this medicine? Take this medicine by mouth with a glass of water. Follow the directions on the prescription label. Take your medicine at regular intervals. Do not take it more often than directed. Do not stop taking except on your doctor's advice. A special MedGuide will be given to you by the pharmacist with each prescription and refill. Be sure to read this information carefully each time. Talk to your pediatrician regarding the use of this medicine in children. Special care may be needed. Overdosage: If you think you have taken too much of this medicine contact a poison control center or emergency room at once. NOTE: This medicine is only for you. Do not share this medicine with others. What if I miss a dose? If you miss a dose, take it as soon as you can. If it is almost time for your next dose, take only that dose. Do not take double or extra doses. What may interact with this medicine? Do not take this medicine with any of the following medications:  certain antiviral medicines for HIV or AIDS like delavirdine,  indinavir  certain medicines for fungal infections like ketoconazole and itraconazole  narcotic medicines for cough  sodium oxybate This medicine may also interact with the following medications:  alcohol  antihistamines for allergy, cough and cold  certain antibiotics like clarithromycin, erythromycin, isoniazid, rifampin, rifapentine, rifabutin, and troleandomycin  certain medicines for blood pressure, heart disease, irregular heart beat  certain medicines for depression, like amitriptyline, fluoxetine, sertraline  certain medicines for seizures like carbamazepine, oxcarbazepine, phenobarbital, phenytoin, primidone  cimetidine  cyclosporine  male hormones, like estrogens or progestins and birth control pills, patches, rings, or injections  general anesthetics like halothane, isoflurane, methoxyflurane, propofol  grapefruit juice  local anesthetics like lidocaine, pramoxine, tetracaine  medicines that relax muscles for surgery  narcotic medicines for pain  other antiviral medicines for HIV or AIDS  phenothiazines like chlorpromazine, mesoridazine, prochlorperazine, thioridazine This list may not describe all possible interactions. Give your health care provider a list of all the medicines, herbs, non-prescription drugs, or dietary supplements you use. Also tell them if you smoke, drink alcohol, or use illegal drugs. Some items may interact with your medicine. What should I watch for while using this medicine? Tell your doctor or health care professional if your symptoms do not start to get better or if they get worse. Do not stop taking except on your doctor's advice. You may develop a severe reaction. Your doctor will tell you how much medicine to take. You may get drowsy or dizzy. Do not drive, use machinery, or do anything that needs mental alertness until you know how this medicine affects  you. To reduce the risk of dizzy and fainting spells, do not stand or sit up  quickly, especially if you are an older patient. Alcohol may increase dizziness and drowsiness. Avoid alcoholic drinks. If you are taking another medicine that also causes drowsiness, you may have more side effects. Give your health care provider a list of all medicines you use. Your doctor will tell you how much medicine to take. Do not take more medicine than directed. Call emergency for help if you have problems breathing or unusual sleepiness. What side effects may I notice from receiving this medicine? Side effects that you should report to your doctor or health care professional as soon as possible:  allergic reactions like skin rash, itching or hives, swelling of the face, lips, or tongue  breathing problems  confusion  loss of balance or coordination  signs and symptoms of low blood pressure like dizziness; feeling faint or lightheaded, falls; unusually weak or tired  suicidal thoughts or other mood changes Side effects that usually do not require medical attention (report to your doctor or health care professional if they continue or are bothersome):  dizziness  dry mouth  nausea, vomiting  tiredness This list may not describe all possible side effects. Call your doctor for medical advice about side effects. You may report side effects to FDA at 1-800-FDA-1088. Where should I keep my medicine? Keep out of the reach of children. This medicine can be abused. Keep your medicine in a safe place to protect it from theft. Do not share this medicine with anyone. Selling or giving away this medicine is dangerous and against the law. Store at room temperature between 20 and 25 degrees C (68 and 77 degrees F). This medicine may cause accidental overdose and death if taken by other adults, children, or pets. Mix any unused medicine with a substance like cat litter or coffee grounds. Then throw the medicine away in a sealed container like a sealed bag or a coffee can with a lid. Do not use  the medicine after the expiration date. NOTE: This sheet is a summary. It may not cover all possible information. If you have questions about this medicine, talk to your doctor, pharmacist, or health care provider.  2020 Elsevier/Gold Standard (2015-06-17 13:47:25) Desvenlafaxine extended-release tablets What is this medicine? DESVENLAFAXINE (des VEN [employer]) is used to treat depression. This medicine may be used for other purposes; ask your health care provider or pharmacist if you have questions. COMMON BRAND NAME(S): Bland Span, Pristiq What should I tell my health care provider before I take this medicine? They need to know if you have any of these conditions:  glaucoma  high blood pressure  kidney disease  liver disease  mania or bipolar disorder  suicidal thoughts or a previous suicide attempt  an unusual reaction to desvenlafaxine, venlafaxine, other medicines, foods, dyes, or preservatives  pregnant or trying to get pregnant  breast-feeding How should I use this medicine? Take this medicine by mouth with a drink of water. Do not crush, cut or chew. Follow the directions on the prescription label. Take your doses at regular intervals. Do not take your medicine more often than directed. Do not stop taking this medicine suddenly except upon the advice of your doctor. Stopping this medicine too quickly may cause serious side effects or your condition may worsen. Contact your pediatrician or health care professional regarding the use of this medicine in children. Special care may be needed. A special MedGuide will be  given to you by the pharmacist with each prescription and refill. Be sure to read this information carefully each time. Overdosage: If you think you have taken too much of this medicine contact a poison control center or emergency room at once. NOTE: This medicine is only for you. Do not share this medicine with others. What if I miss a dose? If you miss a dose,  take it as soon as you can. If it is almost time for your next dose, take only that dose. Do not take double or extra doses. What may interact with this medicine? Do not take this medicine with any of the following medications:  duloxetine  levomilnacipran  linezolid  MAOIs like Carbex, Eldepryl, Marplan, Nardil, and Parnate  methylene blue (injected into a vein)  milnacipran  venlafaxine This medicine may also interact with the following medications:  alcohol  amphetamines  aspirin and aspirin-like medicines  certain migraine headache medicines (almotriptan, eletriptan, frovatriptan, naratriptan, rizatriptan, sumatriptan, zolmitriptan)  dexfenfluramine or fenfluramine  furazolidone  isoniazid  lithium  medicines for heart rhythm or blood pressure  medicines that treat or prevent blood clots like warfarin, enoxaparin, and dalteparin  methylphenidate  metoclopramide  NSAIDS, medicines for pain and inflammation, like ibuprofen or naproxen  pentazocine  phentermine  procarbazine  protriptyline  rasagiline  sibutramine  St. John's wort, Hypericum perforatum  tramadol  tryptophan  zolpidem This list may not describe all possible interactions. Give your health care provider a list of all the medicines, herbs, non-prescription drugs, or dietary supplements you use. Also tell them if you smoke, drink alcohol, or use illegal drugs. Some items may interact with your medicine. What should I watch for while using this medicine? Tell your doctor if your symptoms do not get better or if they get worse. Visit your doctor or health care professional for regular checks on your progress. Because it may take several weeks to see the full effects of this medicine, it is important to continue your treatment as prescribed by your doctor. Patients and their families should watch out for new or worsening thoughts of suicide or depression. Also watch out for sudden changes  in feelings such as feeling anxious, agitated, panicky, irritable, hostile, aggressive, impulsive, severely restless, overly excited and hyperactive, or not being able to sleep. If this happens, especially at the beginning of treatment or after a change in dose, call your health care professional. This medicine can cause an increase in blood pressure. Check with your doctor for instructions on monitoring your blood pressure while taking this medicine. You may get drowsy or dizzy. Do not drive, use machinery, or do anything that needs mental alertness until you know how this medicine affects you. Do not stand or sit up quickly, especially if you are an older patient. This reduces the risk of dizzy or fainting spells. Alcohol may interfere with the effect of this medicine. Avoid alcoholic drinks. Your mouth may get dry. Chewing sugarless gum, sucking hard candy and drinking plenty of water will help. Contact your doctor if the problem does not go away or is severe. What side effects may I notice from receiving this medicine? Side effects that you should report to your doctor or health care professional as soon as possible:  allergic reactions like skin rash, itching or hives, swelling of the face, lips, or tongue  anxious  breathing problems  confusion  changes in vision  chest pain  confusion  elevated mood, decreased need for sleep, racing thoughts, impulsive behavior  eye pain  fast, irregular heartbeat  feeling faint or lightheaded, falls  feeling agitated, angry, or irritable  hallucination, loss of contact with reality  high blood pressure  loss of balance or coordination  palpitations  redness, blistering, peeling or loosening of the skin, including inside the mouth  restlessness, pacing, inability to keep still  seizures  stiff muscles  suicidal thoughts or other mood changes  trouble passing urine or change in the amount of urine  trouble sleeping  unusual  bleeding or bruising  unusually weak or tired  vomiting Side effects that usually do not require medical attention (report to your doctor or health care professional if they continue or are bothersome):  change in sex drive or performance  change in appetite or weight  constipation  dizziness  dry mouth  headache  increased sweating  nausea  tired This list may not describe all possible side effects. Call your doctor for medical advice about side effects. You may report side effects to FDA at 1-800-FDA-1088. Where should I keep my medicine? Keep out of the reach of children. Store at room temperature between 15 and 30 degrees C (59 and 86 degrees F). Throw away any unused medicine after the expiration date. NOTE: This sheet is a summary. It may not cover all possible information. If you have questions about this medicine, talk to your doctor, pharmacist, or health care provider.  2020 Elsevier/Gold Standard (2016-02-17 17:35:48)

## 2019-06-17 NOTE — Telephone Encounter (Signed)
Call pt back at 1:10 for screening.  At work - cannot answer.  Thanks, [employer]

## 2019-06-18 DIAGNOSIS — M7522 Bicipital tendinitis, left shoulder: Secondary | ICD-10-CM | POA: Diagnosis not present

## 2019-07-14 DIAGNOSIS — M13812 Other specified arthritis, left shoulder: Secondary | ICD-10-CM | POA: Diagnosis not present

## 2019-07-14 DIAGNOSIS — M2550 Pain in unspecified joint: Secondary | ICD-10-CM | POA: Diagnosis not present

## 2019-07-17 NOTE — Progress Notes (Deleted)
       Patient: Phillip Santana Male    DOB: [birth year]   48 y.o.   MRN: [id number] Visit Date: 07/17/2019  Today's Provider: Mar Daring, PA-C   No chief complaint on file.  Subjective:     HPI  Depression:He is here for follow-up. He was to restart Pristiq and stop Wellbutrin.  No Known Allergies   Current Outpatient Medications:  .  albuterol (PROVENTIL HFA;VENTOLIN HFA) 108 (90 Base) MCG/ACT inhaler, Inhale 2 puffs into the lungs every 6 (six) hours as needed for wheezing or shortness of breath., Disp: 1 Inhaler, Rfl: 0 .  ALPRAZolam (XANAX) 0.5 MG tablet, Take 1 tablet (0.5 mg total) by mouth 2 (two) times daily as needed for anxiety., Disp: 60 tablet, Rfl: 5 .  azelastine (ASTELIN) 0.1 % nasal spray, Place 2 sprays into both nostrils 2 (two) times daily. Use in each nostril as directed, Disp: 30 mL, Rfl: 12 .  buPROPion (WELLBUTRIN SR) 150 MG 12 hr tablet, Start off taking 150 mg once daily. May increase to 150 mg twice daily., Disp: 60 tablet, Rfl: 2 .  desvenlafaxine (PRISTIQ) 100 MG 24 hr tablet, Take 1 tablet (100 mg total) by mouth daily., Disp: 90 tablet, Rfl: 1 .  fluticasone (FLONASE) 50 MCG/ACT nasal spray, Place 2 sprays into both nostrils daily., Disp: 48 g, Rfl: 1 .  ibuprofen (ADVIL,MOTRIN) 800 MG tablet, Take 1 tablet (800 mg total) by mouth every 8 (eight) hours as needed., Disp: 90 tablet, Rfl: 1 .  Lysine 1000 MG TABS, Take 1 tablet by mouth daily., Disp: , Rfl:  .  Multiple Vitamin (MULTIVITAMIN) capsule, Take 1 capsule by mouth daily., Disp: , Rfl:  .  omeprazole (PRILOSEC) 40 MG capsule, Take 1 capsule (40 mg total) by mouth daily. (Patient not taking: Reported on 03/28/2019), Disp: 90 capsule, Rfl: 3  Current Facility-Administered Medications:  .  neomycin-bacitracin-polymyxin (NEOSPORIN) ointment packet, , Topical, Once, Jerrol Banana., MD  Review of Systems  Social History   Tobacco Use  . Smoking status: Never Smoker  . Smokeless  tobacco: Never Used  Substance Use Topics  . Alcohol use: Yes    Alcohol/week: 12.0 standard drinks    Types: 12 Cans of beer per week    Comment: occasionally      Objective:   There were no vitals taken for this visit. There were no vitals filed for this visit.There is no height or weight on file to calculate BMI.   Physical Exam   No results found for any visits on 07/18/19.     Assessment & [place], PA-C  [facility]

## 2019-07-18 ENCOUNTER — Ambulatory Visit: Payer: Self-pay | Admitting: Physician Assistant

## 2019-08-05 DIAGNOSIS — M7552 Bursitis of left shoulder: Secondary | ICD-10-CM | POA: Diagnosis not present

## 2019-08-05 DIAGNOSIS — M79642 Pain in left hand: Secondary | ICD-10-CM | POA: Diagnosis not present

## 2019-08-05 DIAGNOSIS — M1712 Unilateral primary osteoarthritis, left knee: Secondary | ICD-10-CM | POA: Diagnosis not present

## 2019-08-05 DIAGNOSIS — M79641 Pain in right hand: Secondary | ICD-10-CM | POA: Diagnosis not present

## 2019-08-12 ENCOUNTER — Ambulatory Visit (INDEPENDENT_AMBULATORY_CARE_PROVIDER_SITE_OTHER): Payer: BC Managed Care – PPO | Admitting: Physician Assistant

## 2019-08-12 DIAGNOSIS — R12 Heartburn: Secondary | ICD-10-CM

## 2019-08-12 DIAGNOSIS — R05 Cough: Secondary | ICD-10-CM | POA: Diagnosis not present

## 2019-08-12 DIAGNOSIS — Z9109 Other allergy status, other than to drugs and biological substances: Secondary | ICD-10-CM

## 2019-08-12 DIAGNOSIS — R059 Cough, unspecified: Secondary | ICD-10-CM

## 2019-08-12 MED ORDER — BENZONATATE 200 MG PO CAPS: 200.0000 mg | ORAL_CAPSULE | Freq: Three times a day (TID) | ORAL | 0 refills | Status: DC | PRN

## 2019-08-12 NOTE — Progress Notes (Signed)
Patient: Phillip Santana Male    DOB: 16-Aug-1971   48 y.o.   MRN: [id number] Visit Date: 08/12/2019  Today's Provider: Trey Sailors, PA-C   Chief Complaint  Patient presents with  . URI   Subjective:    Virtual Visit via Telephone Note  I connected with Phillip Santana on 08/12/19 at  2:40 PM EST by telephone and verified that I am speaking with the correct person using two identifiers.  Location: Patient: Home Provider: Office   I discussed the limitations, risks, security and privacy concerns of performing an evaluation and management service by telephone and the availability of in person appointments. I also discussed with the patient that there may be a patient responsible charge related to this service. The patient expressed understanding and agreed to proceed.  Cough The current episode started more than 1 month ago. The problem occurs constantly. The cough is non-productive. Associated symptoms include a sore throat, shortness of breath (at night sometimes per pt) and wheezing. Pertinent negatives include no chills, fever, headaches, nasal congestion or postnasal drip. He has tried OTC cough suppressant for the symptoms. The treatment provided moderate relief.   Reports that he has some SOB. Reports he tried to take claritin and zyrtec which he reports makes him drowsy. Reports he tried taking Claritin. Hasn't had to take heartburn medication in several months due lack of symptoms.     No Known Allergies   Current Outpatient Medications:  .  albuterol (PROVENTIL HFA;VENTOLIN HFA) 108 (90 Base) MCG/ACT inhaler, Inhale 2 puffs into the lungs every 6 (six) hours as needed for wheezing or shortness of breath., Disp: 1 Inhaler, Rfl: 0 .  ALPRAZolam (XANAX) 0.5 MG tablet, Take 1 tablet (0.5 mg total) by mouth 2 (two) times daily as needed for anxiety., Disp: 60 tablet, Rfl: 5 .  azelastine (ASTELIN) 0.1 % nasal spray, Place 2 sprays into both nostrils 2 (two) times daily.  Use in each nostril as directed, Disp: 30 mL, Rfl: 12 .  buPROPion (WELLBUTRIN SR) 150 MG 12 hr tablet, Start off taking 150 mg once daily. May increase to 150 mg twice daily., Disp: 60 tablet, Rfl: 2 .  desvenlafaxine (PRISTIQ) 100 MG 24 hr tablet, Take 1 tablet (100 mg total) by mouth daily., Disp: 90 tablet, Rfl: 1 .  fluticasone (FLONASE) 50 MCG/ACT nasal spray, Place 2 sprays into both nostrils daily., Disp: 48 g, Rfl: 1 .  ibuprofen (ADVIL,MOTRIN) 800 MG tablet, Take 1 tablet (800 mg total) by mouth every 8 (eight) hours as needed., Disp: 90 tablet, Rfl: 1 .  Lysine 1000 MG TABS, Take 1 tablet by mouth daily., Disp: , Rfl:  .  Multiple Vitamin (MULTIVITAMIN) capsule, Take 1 capsule by mouth daily., Disp: , Rfl:  .  omeprazole (PRILOSEC) 40 MG capsule, Take 1 capsule (40 mg total) by mouth daily. (Patient not taking: Reported on 03/28/2019), Disp: 90 capsule, Rfl: 3  Current Facility-Administered Medications:  .  neomycin-bacitracin-polymyxin (NEOSPORIN) ointment packet, , Topical, Once, Maple Hudson., MD  Review of Systems  Constitutional: Negative for chills, fatigue and fever.  HENT: Positive for sore throat. Negative for postnasal drip, sinus pressure, sinus pain and sneezing.   Respiratory: Positive for cough, shortness of breath (at night sometimes per pt) and wheezing.   Neurological: Negative for headaches.    Social History   Tobacco Use  . Smoking status: Never Smoker  . Smokeless tobacco: Never Used  Substance Use Topics  .  Alcohol use: Yes    Alcohol/week: 12.0 standard drinks    Types: 12 Cans of beer per week    Comment: occasionally      Objective:   There were no vitals taken for this visit. There were no vitals filed for this visit.There is no height or weight on file to calculate BMI.   Physical Exam   No results found for any visits on 08/12/19.     Assessment & Plan    1. Cough  Suspect long term dry cough is due to allergies or GERD.  Advised patient to start 2nd gen antihistamine daily and restart PPI.   - benzonatate (TESSALON) 200 MG capsule; Take 1 capsule (200 mg total) by mouth 3 (three) times daily as needed for cough.  Dispense: 20 capsule; Refill: 0  2. Heartburn   3. Environmental allergies   I discussed the assessment and treatment plan with the patient. The patient was provided an opportunity to ask questions and all were answered. The patient agreed with the plan and demonstrated an understanding of the instructions.   The patient was advised to call back or seek an in-person evaluation if the symptoms worsen or if the condition fails to improve as anticipated.  I provided 15 minutes of non-face-to-face time during this encounter.     Trinna Post, PA-C  [facility]

## 2019-08-13 NOTE — Patient Instructions (Signed)

## 2019-08-18 ENCOUNTER — Telehealth: Payer: Self-pay

## 2019-08-18 DIAGNOSIS — R053 Chronic cough: Secondary | ICD-10-CM

## 2019-08-18 DIAGNOSIS — R05 Cough: Secondary | ICD-10-CM

## 2019-08-18 NOTE — Telephone Encounter (Signed)
Patient advised as directed below. 

## 2019-08-18 NOTE — Telephone Encounter (Signed)
Will order CXR for chronic cough

## 2019-08-18 NOTE — Telephone Encounter (Signed)
Received fax from the telemed patient's wife Phillip Santana stated that her "husband has a really bad cough. He did have a virtual visit and they gave him some pills for his cough and they are working he is up all night coughing."  Called to check in on patient reports that he took some allergies pills and was able to sleep a little better, but a few hours ago he started to cough a lot( dry cough) but nothing comes up. He reports that this has been going on for about 2 months and is just not going away.  Pharmacy:[facility].

## 2019-08-19 ENCOUNTER — Telehealth: Payer: Self-pay

## 2019-08-19 NOTE — Telephone Encounter (Signed)
Phillip Santana, Phillip Santana (Patient) Phillip Santana, Phillip Santana (Patient) Appointment Scheduling - Scheduling Inquiry for Clinic  Summary: chest xray in [place]  Reason for CRM: pt wife is calling and her husband is living with his mother in [place] and does not want to go back to [place] for chest xray and would like to go to a location in [place]

## 2019-08-22 ENCOUNTER — Ambulatory Visit
Admission: RE | Admit: 2019-08-22 | Discharge: 2019-08-22 | Disposition: A | Discharge status: Home / Self Care | Payer: BC Managed Care – PPO | Source: Ambulatory Visit | Attending: Physician Assistant | Admitting: Physician Assistant

## 2019-08-22 ENCOUNTER — Telehealth: Payer: Self-pay | Admitting: *Deleted

## 2019-08-22 ENCOUNTER — Other Ambulatory Visit: Payer: Self-pay

## 2019-08-22 DIAGNOSIS — R05 Cough: Secondary | ICD-10-CM | POA: Diagnosis not present

## 2019-08-22 DIAGNOSIS — R053 Chronic cough: Secondary | ICD-10-CM

## 2019-08-22 NOTE — Telephone Encounter (Signed)
Source Subject Topic  Phillip Santana "Phillip Santana" (Patient) Phillip Santana "Phillip Santana" (Patient) General - Inquiry  Summary: childhood shot records  Reason for CRM: Patient is checking status of childhood records was sent over to office was received   Call back [phone]

## 2019-08-25 ENCOUNTER — Telehealth: Payer: Self-pay

## 2019-08-25 DIAGNOSIS — J4540 Moderate persistent asthma, uncomplicated: Secondary | ICD-10-CM

## 2019-08-25 MED ORDER — FLUTICASONE FUROATE-VILANTEROL 200-25 MCG/INH IN AEPB: 1.0000 | INHALATION_SPRAY | Freq: Every day | RESPIRATORY_TRACT | 0 refills | Status: DC

## 2019-08-25 NOTE — Telephone Encounter (Signed)
Patient advised as directed below. 

## 2019-08-25 NOTE — Telephone Encounter (Signed)
Pt advised.  He is wanting to know what he should do next.  Pt is complaining of a dry cough that is been going on for two months.  He also states he wheezes especially at night.  Pt denies other symptoms such as heartburn/reflux  (Currently taking omeprazole)  Fevers, loss of taste or smell.   Thanks,   -Mickel Baas

## 2019-08-25 NOTE — Telephone Encounter (Signed)
Have you received his immunizations?

## 2019-08-25 NOTE — Telephone Encounter (Signed)
Sent in [place] inhaler. Try this once daily inhalation x 2 weeks and call if still not better.

## 2019-08-25 NOTE — Telephone Encounter (Signed)
-----   Message from Mar Daring, [place] sent at 08/25/2019  8:24 AM EST ----- CXR is unremarkable and normal.

## 2019-08-26 NOTE — Telephone Encounter (Signed)
Advised pt that we have not receive records yet.

## 2019-09-02 NOTE — Telephone Encounter (Signed)
I do not recall seeing records for pt. Thanks TNP

## 2019-10-01 ENCOUNTER — Other Ambulatory Visit: Payer: Self-pay | Admitting: Physician Assistant

## 2019-10-01 DIAGNOSIS — J4540 Moderate persistent asthma, uncomplicated: Secondary | ICD-10-CM

## 2019-10-29 ENCOUNTER — Other Ambulatory Visit: Payer: Self-pay | Admitting: Physician Assistant

## 2019-10-29 DIAGNOSIS — J4540 Moderate persistent asthma, uncomplicated: Secondary | ICD-10-CM

## 2019-12-03 ENCOUNTER — Other Ambulatory Visit: Payer: Self-pay | Admitting: Physician Assistant

## 2019-12-03 DIAGNOSIS — J4540 Moderate persistent asthma, uncomplicated: Secondary | ICD-10-CM

## 2020-01-12 ENCOUNTER — Other Ambulatory Visit: Payer: Self-pay | Admitting: Physician Assistant

## 2020-01-12 DIAGNOSIS — J4540 Moderate persistent asthma, uncomplicated: Secondary | ICD-10-CM

## 2020-01-12 DIAGNOSIS — H1013 Acute atopic conjunctivitis, bilateral: Secondary | ICD-10-CM

## 2020-02-21 DIAGNOSIS — N201 Calculus of ureter: Secondary | ICD-10-CM | POA: Diagnosis not present

## 2020-02-21 DIAGNOSIS — N132 Hydronephrosis with renal and ureteral calculous obstruction: Secondary | ICD-10-CM | POA: Diagnosis not present

## 2020-02-21 DIAGNOSIS — R1084 Generalized abdominal pain: Secondary | ICD-10-CM | POA: Diagnosis not present

## 2020-02-21 DIAGNOSIS — I10 Essential (primary) hypertension: Secondary | ICD-10-CM | POA: Diagnosis not present

## 2020-02-21 DIAGNOSIS — R1033 Periumbilical pain: Secondary | ICD-10-CM | POA: Diagnosis not present

## 2020-02-21 DIAGNOSIS — R1110 Vomiting, unspecified: Secondary | ICD-10-CM | POA: Diagnosis not present

## 2020-02-21 DIAGNOSIS — Z20822 Contact with and (suspected) exposure to covid-19: Secondary | ICD-10-CM | POA: Diagnosis not present

## 2020-03-24 ENCOUNTER — Other Ambulatory Visit: Payer: Self-pay | Admitting: Physician Assistant

## 2020-03-24 DIAGNOSIS — J4540 Moderate persistent asthma, uncomplicated: Secondary | ICD-10-CM

## 2020-04-08 ENCOUNTER — Telehealth: Payer: Self-pay

## 2020-04-08 NOTE — Telephone Encounter (Signed)
I have a 920 tomorrow. If symptoms worsen he needs to be seen at [facility] or ER

## 2020-04-08 NOTE — Telephone Encounter (Signed)
Called patient's wife back an offered the 9:20 am appointment and she is going to check with her husband first and is going to send a my chart message to let us know.

## 2020-04-08 NOTE — Telephone Encounter (Signed)
Copied from CRM [id number]. Topic: General - Inquiry >> Apr 08, 2020  8:40 AM Deborha Payment wrote: Reason for CRM: Patient wife called requesting an appt within this week early next. Patient is having trouble breathing and is having to use inhaler more often.  Patient wife was not with husband.  PCP does not have anything and patient only wants to see PCP Call back [phone]

## 2020-04-22 ENCOUNTER — Ambulatory Visit: Payer: BC Managed Care – PPO | Admitting: Physician Assistant

## 2020-04-22 ENCOUNTER — Other Ambulatory Visit: Payer: Self-pay

## 2020-04-22 ENCOUNTER — Encounter: Payer: Self-pay | Admitting: Physician Assistant

## 2020-04-22 VITALS — BP 105/73 | HR 74 | Temp 96.9°F | Wt 187.0 lb

## 2020-04-22 DIAGNOSIS — F1010 Alcohol abuse, uncomplicated: Secondary | ICD-10-CM | POA: Diagnosis not present

## 2020-04-22 DIAGNOSIS — R0602 Shortness of breath: Secondary | ICD-10-CM | POA: Diagnosis not present

## 2020-04-22 DIAGNOSIS — J301 Allergic rhinitis due to pollen: Secondary | ICD-10-CM

## 2020-04-22 DIAGNOSIS — Z6829 Body mass index (BMI) 29.0-29.9, adult: Secondary | ICD-10-CM | POA: Diagnosis not present

## 2020-04-22 MED ORDER — ALBUTEROL SULFATE HFA 108 (90 BASE) MCG/ACT IN AERS: 2.0000 | INHALATION_SPRAY | Freq: Four times a day (QID) | RESPIRATORY_TRACT | 5 refills | Status: AC | PRN

## 2020-04-22 NOTE — Patient Instructions (Signed)
Shortness of Breath, Adult Shortness of breath means you have trouble breathing. Shortness of breath could be a sign of a medical problem. Follow these instructions at home:   Watch for any changes in your symptoms.  Do not use any products that contain nicotine or tobacco, such as cigarettes, e-cigarettes, and chewing tobacco.  Do not smoke. Smoking can cause shortness of breath. If you need help to quit smoking, ask your doctor.  Avoid things that can make it harder to breathe, such as: ? Mold. ? Dust. ? Air pollution. ? Chemical smells. ? Things that can cause allergy symptoms (allergens), if you have allergies.  Keep your living space clean. Use products that help remove mold and dust.  Rest as needed. Slowly return to your normal activities.  Take over-the-counter and prescription medicines only as told by your doctor. This includes oxygen therapy and inhaled medicines.  Keep all follow-up visits as told by your doctor. This is important. Contact a doctor if:  Your condition does not get better as soon as expected.  You have a hard time doing your normal activities, even after you rest.  You have new symptoms. Get help right away if:  Your shortness of breath gets worse.  You have trouble breathing when you are resting.  You feel light-headed or you pass out (faint).  You have a cough that is not helped by medicines.  You cough up blood.  You have pain with breathing.  You have pain in your chest, arms, shoulders, or belly (abdomen).  You have a fever.  You cannot walk up stairs.  You cannot exercise the way you normally do. These symptoms may represent a serious problem that is an emergency. Do not wait to see if the symptoms will go away. Get medical help right away. Call your local emergency services (911 in the [place] Do not drive yourself to the hospital. Summary  Shortness of breath is when you have trouble breathing enough air. It can be a sign of a  medical problem.  Avoid things that make it hard for you to breathe, such as smoking, pollution, mold, and dust.  Watch for any changes in your symptoms. Contact your doctor if you do not get better or you get worse. This information is not intended to replace advice given to you by your health care provider. Make sure you discuss any questions you have with your health care provider. Document Revised: 02/18/2018 Document Reviewed: 02/18/2018 Elsevier Patient Education  2020 Elsevier Inc.  

## 2020-04-22 NOTE — Progress Notes (Signed)
I,Laura E Walsh,acting as a Neurosurgeon for [employer], PA-C.,have documented all relevant documentation on the behalf of Margaretann Loveless, PA-C,as directed by  Margaretann Loveless, PA-C while in the presence of Margaretann Loveless, [place].   Established patient visit   Patient: Phillip Santana   DOB: September 07, 1971   49 y.o. Male  MRN: [id number] Visit Date: 04/22/2020  Today's healthcare provider: Margaretann Loveless, PA-C   Chief Complaint  Patient presents with  . Breathing Problem   Subjective    Breathing Problem He complains of difficulty breathing (Pt states he feels like he can't take a deep breath.), shortness of breath and wheezing (Worse at night. ). There is no cough or hemoptysis. This is a chronic problem. The problem has been gradually worsening (Especially in the last three weeks. ). Associated symptoms include headaches, heartburn, malaise/fatigue and postnasal drip. Pertinent negatives include no appetite change, chest pain, dyspnea on exertion, ear pain, fever, sore throat or trouble swallowing. His symptoms are aggravated by lying down. There is no history of asthma or COPD.    He does have history of anxiety and depression but states he feels he has been doing ok recently.   Does have GERD and has used omeprazole. Reports he stopped the omeprazole because he was feeling better. He reports this feels different than his normal GERD symptoms. He is sleeping elevated up on pillows. He does mention he is eating larger meals at dinnertime/later in the evening as he and his family are currently living with his mother (she is cooking).   He does drink alcohol but reports he only drinks on the weekends, but can drink 4-8 beers in the one night he does drink. He does not associate any worsening symptoms on the nights he drinks vs the nights he does not.   He denies any SOB or issues when exercising, weight lifting, working or doing any other strenuous activity. Reports it is  only at night when he lies down. May be some component of post nasal drainage as he admits to having this also when he lies down.   Has had sleep study in the past (before he ever established with me, so before 2016) that was inconclusive. He has declined to get another since then due to cost and worried it will be inconclusive again.   During interview he does mention having an aunt that recently passed in her early 24s due to COPD complications and passed in her sleep.   Patient Active Problem List   Diagnosis Date Noted  . Seasonal allergic rhinitis due to pollen 04/27/2020  . SOB (shortness of breath) 04/22/2020  . BMI 29.0-29.9,adult 04/22/2020  . Primary osteoarthritis of left knee 05/08/2016  . Lumbar herniated disc 05/08/2016  . Hypercholesterolemia with hypertriglyceridemia 02/01/2016  . Night sweats 09/07/2015  . Depression 04/12/2015  . Alcohol abuse, episodic 03/10/2015  . Anxiety 03/10/2015  . Blood pressure elevated 03/10/2015  . Malaise and fatigue 03/03/2010  . Generalized muscle weakness 03/03/2010  . Visual disturbance 06/23/2008   No past medical history on file. Social History   Tobacco Use  . Smoking status: Never Smoker  . Smokeless tobacco: Never Used  Substance Use Topics  . Alcohol use: Yes    Alcohol/week: 12.0 standard drinks    Types: 12 Cans of beer per week    Comment: occasionally  . Drug use: No   No Known Allergies   Medications: Outpatient Medications Prior to Visit  Medication  Sig  . azelastine (ASTELIN) 0.1 % nasal spray Place 2 sprays into both nostrils 2 (two) times daily. Use in each nostril as directed  . BREO ELLIPTA 200-25 MCG/INH AEPB INHALE 1 PUFF INTO THE LUNGS ONCE DAILY  . fluticasone (FLONASE) 50 MCG/ACT nasal spray Place 2 sprays into both nostrils daily.  Marland Kitchen ibuprofen (ADVIL,MOTRIN) 800 MG tablet Take 1 tablet (800 mg total) by mouth every 8 (eight) hours as needed.  Marland Kitchen Lysine 1000 MG TABS Take 1 tablet by mouth daily.  .  Multiple Vitamin (MULTIVITAMIN) capsule Take 1 capsule by mouth daily.  . [DISCONTINUED] albuterol (PROVENTIL HFA;VENTOLIN HFA) 108 (90 Base) MCG/ACT inhaler Inhale 2 puffs into the lungs every 6 (six) hours as needed for wheezing or shortness of breath.  . [DISCONTINUED] ALPRAZolam (XANAX) 0.5 MG tablet Take 1 tablet (0.5 mg total) by mouth 2 (two) times daily as needed for anxiety. (Patient not taking: Reported on 04/22/2020)  . [DISCONTINUED] benzonatate (TESSALON) 200 MG capsule Take 1 capsule (200 mg total) by mouth 3 (three) times daily as needed for cough.  . [DISCONTINUED] buPROPion (WELLBUTRIN SR) 150 MG 12 hr tablet Start off taking 150 mg once daily. May increase to 150 mg twice daily. (Patient not taking: Reported on 04/22/2020)  . [DISCONTINUED] desvenlafaxine (PRISTIQ) 100 MG 24 hr tablet Take 1 tablet (100 mg total) by mouth daily. (Patient not taking: Reported on 04/22/2020)  . [DISCONTINUED] omeprazole (PRILOSEC) 40 MG capsule Take 1 capsule (40 mg total) by mouth daily. (Patient not taking: Reported on 04/22/2020)   Facility-Administered Medications Prior to Visit  Medication Dose Route Frequency Provider  . neomycin-bacitracin-polymyxin (NEOSPORIN) ointment packet   Topical Once Maple Hudson., MD    Review of Systems  Constitutional: Positive for malaise/fatigue. Negative for appetite change, fatigue and fever.  HENT: Positive for postnasal drip. Negative for congestion, ear pain, sinus pressure, sinus pain, sore throat and trouble swallowing.   Respiratory: Positive for shortness of breath and wheezing (Worse at night. ). Negative for apnea, cough, hemoptysis, choking and chest tightness.   Cardiovascular: Negative for chest pain, dyspnea on exertion, palpitations and leg swelling.  Gastrointestinal: Positive for heartburn. Negative for abdominal distention, abdominal pain, anal bleeding, blood in stool, constipation, diarrhea, nausea, rectal pain and vomiting.       Pt  reports having reflux.   Allergic/Immunologic: Positive for environmental allergies.  Neurological: Positive for headaches. Negative for dizziness and light-headedness.       Objective    BP 105/73 (BP Location: Left Arm, Patient Position: Sitting, Cuff Size: Large)   Pulse 74   Temp (!) 96.9 F (36.1 C) (Temporal)   Wt 187 lb (84.8 kg)   SpO2 96%   BMI 29.29 kg/m     Physical Exam Vitals and nursing note reviewed.  Constitutional:      Appearance: Normal appearance. He is well-developed, well-groomed and overweight.  HENT:     Head: Normocephalic and atraumatic.  Neck:     Vascular: No carotid bruit.  Cardiovascular:     Rate and Rhythm: Normal rate and regular rhythm.     Pulses: Normal pulses.     Heart sounds: Normal heart sounds. No murmur heard.   Pulmonary:     Effort: Pulmonary effort is normal. No respiratory distress.     Breath sounds: Normal breath sounds. No wheezing.  Musculoskeletal:     Cervical back: Normal range of motion and neck supple.     Right lower leg: No edema.  Left lower leg: No edema.  Skin:    General: Skin is warm and dry.  Neurological:     Mental Status: He is alert and oriented to person, place, and time. Mental status is at baseline.  Psychiatric:        Mood and Affect: Mood normal.        Behavior: Behavior normal. Behavior is cooperative.        Thought Content: Thought content normal.        Judgment: Judgment normal.       No results found for any visits on 04/22/20.  Assessment & Plan     Problem List Items Addressed This Visit      Respiratory   Seasonal allergic rhinitis due to pollen    Suspect this may be contributing some. Advised to use flonase at bedtime. Continue astelin nasal spray BID. May use albuterol prn      Relevant Medications   albuterol (VENTOLIN HFA) 108 (90 Base) MCG/ACT inhaler     Other   SOB (shortness of breath) - Primary    Chronic and uncontrolled.  Worse especially when lying  down.  Pt states Breo and albuterol helped for about a month but is not longer controlling his symptoms.  May continue to use albuterol inhaler as needed.  Will refer to pulmonology to evaluate and treat.        Relevant Orders   Ambulatory referral to Pulmonology   Alcohol abuse, episodic    Binge drinks (more than 4 drinks) once or twice per week, normally on the weekends. Discussed risks of drinking alcohol and to continue to cut back. He does drink less than he use to, so encouraged to continue and try to limit to 1-2 when he drinks.      BMI 29.0-29.9,adult    Counseled patient on healthy lifestyle modifications including dieting and exercise.           No follow-ups on file.      Delmer Islam, PA-C, have reviewed all documentation for this visit. The documentation on 04/27/20 for the exam, diagnosis, procedures, and orders are all accurate and complete.   Reine Just  [facility] [phone] (phone) [phone] (fax)  [facility]

## 2020-04-22 NOTE — Assessment & Plan Note (Addendum)
Chronic and uncontrolled.  Worse especially when lying down.  Pt states Breo and albuterol helped for about a month but is not longer controlling his symptoms.  May continue to use albuterol inhaler as needed.  Will refer to pulmonology to evaluate and treat.

## 2020-04-27 ENCOUNTER — Encounter: Payer: Self-pay | Admitting: Physician Assistant

## 2020-04-27 DIAGNOSIS — J301 Allergic rhinitis due to pollen: Secondary | ICD-10-CM | POA: Insufficient documentation

## 2020-04-27 NOTE — Assessment & Plan Note (Signed)
Suspect this may be contributing some. Advised to use flonase at bedtime. Continue astelin nasal spray BID. May use albuterol prn

## 2020-04-27 NOTE — Assessment & Plan Note (Signed)
Binge drinks (more than 4 drinks) once or twice per week, normally on the weekends. Discussed risks of drinking alcohol and to continue to cut back. He does drink less than he use to, so encouraged to continue and try to limit to 1-2 when he drinks.

## 2020-04-27 NOTE — Assessment & Plan Note (Signed)
Counseled patient on healthy lifestyle modifications including dieting and exercise.  °

## 2020-06-01 ENCOUNTER — Institutional Professional Consult (permissible substitution): Payer: BC Managed Care – PPO | Admitting: Primary Care

## 2020-06-08 ENCOUNTER — Telehealth: Payer: Self-pay

## 2020-06-08 NOTE — Telephone Encounter (Signed)
Please advise 

## 2020-06-08 NOTE — Telephone Encounter (Signed)
There is no other alternative unfortunately. I would decrease use to just once daily at his bedtime

## 2020-06-08 NOTE — Telephone Encounter (Signed)
Copied from CRM [id number]. Topic: General - Other >> Jun 08, 2020  9:24 AM Lyn Hollingshead D wrote: PT requesting a new mediation, side effects sleepy  /  azelastine (ASTELIN) 0.1 % nasal spray [[id number]]

## 2020-06-08 NOTE — Telephone Encounter (Signed)
Left message on pt's vm advising below. 

## 2020-09-01 DIAGNOSIS — M5411 Radiculopathy, occipito-atlanto-axial region: Secondary | ICD-10-CM | POA: Diagnosis not present

## 2020-09-01 DIAGNOSIS — M9903 Segmental and somatic dysfunction of lumbar region: Secondary | ICD-10-CM | POA: Diagnosis not present

## 2020-09-01 DIAGNOSIS — M531 Cervicobrachial syndrome: Secondary | ICD-10-CM | POA: Diagnosis not present

## 2020-09-01 DIAGNOSIS — M9901 Segmental and somatic dysfunction of cervical region: Secondary | ICD-10-CM | POA: Diagnosis not present

## 2020-09-06 DIAGNOSIS — M531 Cervicobrachial syndrome: Secondary | ICD-10-CM | POA: Diagnosis not present

## 2020-09-06 DIAGNOSIS — M5411 Radiculopathy, occipito-atlanto-axial region: Secondary | ICD-10-CM | POA: Diagnosis not present

## 2020-09-06 DIAGNOSIS — M9901 Segmental and somatic dysfunction of cervical region: Secondary | ICD-10-CM | POA: Diagnosis not present

## 2020-09-06 DIAGNOSIS — M9903 Segmental and somatic dysfunction of lumbar region: Secondary | ICD-10-CM | POA: Diagnosis not present

## 2020-10-25 ENCOUNTER — Other Ambulatory Visit: Payer: Self-pay

## 2020-10-25 ENCOUNTER — Ambulatory Visit (INDEPENDENT_AMBULATORY_CARE_PROVIDER_SITE_OTHER): Payer: BC Managed Care – PPO | Admitting: Physician Assistant

## 2020-10-25 ENCOUNTER — Encounter: Payer: Self-pay | Admitting: Physician Assistant

## 2020-10-25 VITALS — BP 123/83 | HR 69 | Temp 98.1°F | Wt 198.0 lb

## 2020-10-25 DIAGNOSIS — F320 Major depressive disorder, single episode, mild: Secondary | ICD-10-CM

## 2020-10-25 DIAGNOSIS — R7989 Other specified abnormal findings of blood chemistry: Secondary | ICD-10-CM | POA: Diagnosis not present

## 2020-10-25 DIAGNOSIS — N4889 Other specified disorders of penis: Secondary | ICD-10-CM

## 2020-10-25 NOTE — Progress Notes (Signed)
Established patient visit   Patient: Phillip Santana   DOB: 1971/06/09   50 y.o. Male  MRN: [id number] Visit Date: 10/25/2020  Today's healthcare provider: Margaretann Loveless, PA-C   Chief Complaint  Patient presents with  . Depression   Subjective    Depression        This is a chronic problem.The problem is unchanged.  Associated symptoms include decreased concentration and decreased interest.  Associated symptoms include no fatigue, no helplessness, no hopelessness, does not have insomnia and no suicidal ideas.   Depression screen [facility] 2/9 10/25/2020 04/22/2020 03/28/2019  Decreased Interest 3 2 3   Down, Depressed, Hopeless 2 2 3   PHQ - 2 Score 5 4 6   Altered sleeping 2 0 3  Tired, decreased energy 1 3 3   Change in appetite 3 2 0  Feeling bad or failure about yourself  3 3 3   Trouble concentrating 2 1 1   Moving slowly or fidgety/restless 1 0 1  Suicidal thoughts 0 1 0  PHQ-9 Score 17 14 17   Difficult doing work/chores Very difficult Very difficult Extremely dIfficult  Some recent data might be hidden   GAD 7 : Generalized Anxiety Score 10/25/2020 03/28/2019 05/09/2018 04/11/2018  Nervous, Anxious, on Edge 1 1 2 3   Control/stop worrying 1 3 3 3   Worry too much - different things 1 3 3 3   Trouble relaxing 0 0 0 0  Restless 0 0 0 0  Easily annoyed or irritable 2 2 2 2   Afraid - awful might happen 3 2 2 3   Total GAD 7 Score 8 11 12 14   Anxiety Difficulty Somewhat difficult Extremely difficult Extremely difficult Extremely difficult   Patient reports his main reasoning for actually coming today was for penile issues. He reports he is noticing that his penis appears smaller and feels sometimes it tries to go inside of him. He also has been having decreased libido. Does not have much issue starting an erection, some mild decrease in sustaining an erection. He reports it is more the libido and no drive. He has had testosterone checked in 06/2020 and was low at 266. Was not repeated.     Patient Active Problem List   Diagnosis Date Noted  . Seasonal allergic rhinitis due to pollen 04/27/2020  . SOB (shortness of breath) 04/22/2020  . BMI 29.0-29.9,adult 04/22/2020  . Primary osteoarthritis of left knee 05/08/2016  . Lumbar herniated disc 05/08/2016  . Hypercholesterolemia with hypertriglyceridemia 02/01/2016  . Night sweats 09/07/2015  . Depression 04/12/2015  . Alcohol abuse, episodic 03/10/2015  . Anxiety 03/10/2015  . Blood pressure elevated 03/10/2015  . Malaise and fatigue 03/03/2010  . Generalized muscle weakness 03/03/2010  . Visual disturbance 06/23/2008   No past medical history on file. Social History   Tobacco Use  . Smoking status: Never Smoker  . Smokeless tobacco: Never Used  Substance Use Topics  . Alcohol use: Yes    Alcohol/week: 12.0 standard drinks    Types: 12 Cans of beer per week    Comment: occasionally  . Drug use: No   No Known Allergies   Medications: Outpatient Medications Prior to Visit  Medication Sig  . albuterol (VENTOLIN HFA) 108 (90 Base) MCG/ACT inhaler Inhale 2 puffs into the lungs every 6 (six) hours as needed for wheezing or shortness of breath.  06-30-1986 azelastine (ASTELIN) 0.1 % nasal spray Place 2 sprays into both nostrils 2 (two) times daily. Use in each nostril as directed  .  BREO ELLIPTA 200-25 MCG/INH AEPB INHALE 1 PUFF INTO THE LUNGS ONCE DAILY  . fluticasone (FLONASE) 50 MCG/ACT nasal spray Place 2 sprays into both nostrils daily.  Marland Kitchen ibuprofen (ADVIL,MOTRIN) 800 MG tablet Take 1 tablet (800 mg total) by mouth every 8 (eight) hours as needed.  Marland Kitchen Lysine 1000 MG TABS Take 1 tablet by mouth daily.  . Multiple Vitamin (MULTIVITAMIN) capsule Take 1 capsule by mouth daily.   Facility-Administered Medications Prior to Visit  Medication Dose Route Frequency Provider  . neomycin-bacitracin-polymyxin (NEOSPORIN) ointment packet   Topical Once Maple Hudson., MD    Review of Systems  Constitutional: Negative.   Negative for fatigue.  Respiratory: Negative.   Cardiovascular: Negative.   Gastrointestinal: Negative.   Genitourinary: Negative for decreased urine volume, difficulty urinating, dysuria, frequency, hematuria, penile pain, scrotal swelling, testicular pain and urgency.  Psychiatric/Behavioral: Positive for decreased concentration, depression and dysphoric mood. Negative for self-injury, sleep disturbance and suicidal ideas. The patient is nervous/anxious. The patient does not have insomnia.        Objective    BP 123/83 (BP Location: Left Arm, Patient Position: Sitting, Cuff Size: Large)   Pulse 69   Temp 98.1 F (36.7 C) (Oral)   Wt 198 lb (89.8 kg)   BMI 31.01 kg/m     Physical Exam Vitals reviewed.  Constitutional:      General: He is not in acute distress.    Appearance: Normal appearance. He is well-developed and well-nourished. He is not ill-appearing.  HENT:     Head: Normocephalic and atraumatic.  Eyes:     General: No scleral icterus.    Extraocular Movements: EOM normal.     Conjunctiva/sclera: Conjunctivae normal.  Pulmonary:     Effort: Pulmonary effort is normal. No respiratory distress.  Musculoskeletal:     Cervical back: Normal range of motion and neck supple.  Neurological:     General: No focal deficit present.     Mental Status: He is alert and oriented to person, place, and time. Mental status is at baseline.  Psychiatric:        Mood and Affect: Mood and affect and mood normal.        Behavior: Behavior normal.        Thought Content: Thought content normal.        Judgment: Judgment normal.       No results found for any visits on 10/25/20.  Assessment & Plan     1. Depression, major, single episode, mild (HCC) Feels a lot of his depression is stemming from the decreased libido and penile atrophy. Would like to be referred to Urology and undergo investigation for a possible why and treatment options prior to restarting any anti-depression  medications.   2. Low testosterone in male H/O this, last checked in September 2021. Discussed the symptoms he is having can all be associated with low testosterone. Will refer to Urology as below for further evaluation. Consult appreciated.  - Ambulatory referral to Urology  3. Penile atrophy See above medical treatment plan. - Ambulatory referral to Urology   No follow-ups on file.      Delmer Islam, PA-C, have reviewed all documentation for this visit. The documentation on 10/26/20 for the exam, diagnosis, procedures, and orders are all accurate and complete.   Reine Just  [facility] [phone] (phone) [phone] (fax)  [facility]

## 2020-10-26 ENCOUNTER — Encounter: Payer: Self-pay | Admitting: Physician Assistant

## 2020-11-07 IMAGING — CR LEFT SHOULDER - 2+ VIEW
1 series · 4 of 4 positions shown · non-contrast
Comparison: None.

CLINICAL DATA: 47-year-old male with left anterior shoulder pain
for the past month. No recent acute injury

EXAM:
LEFT SHOULDER - 2+ VIEW

[Series 1: dg shoulder left · 0.14mm/px · 4 of 4 slices shown]
[im 1/4]
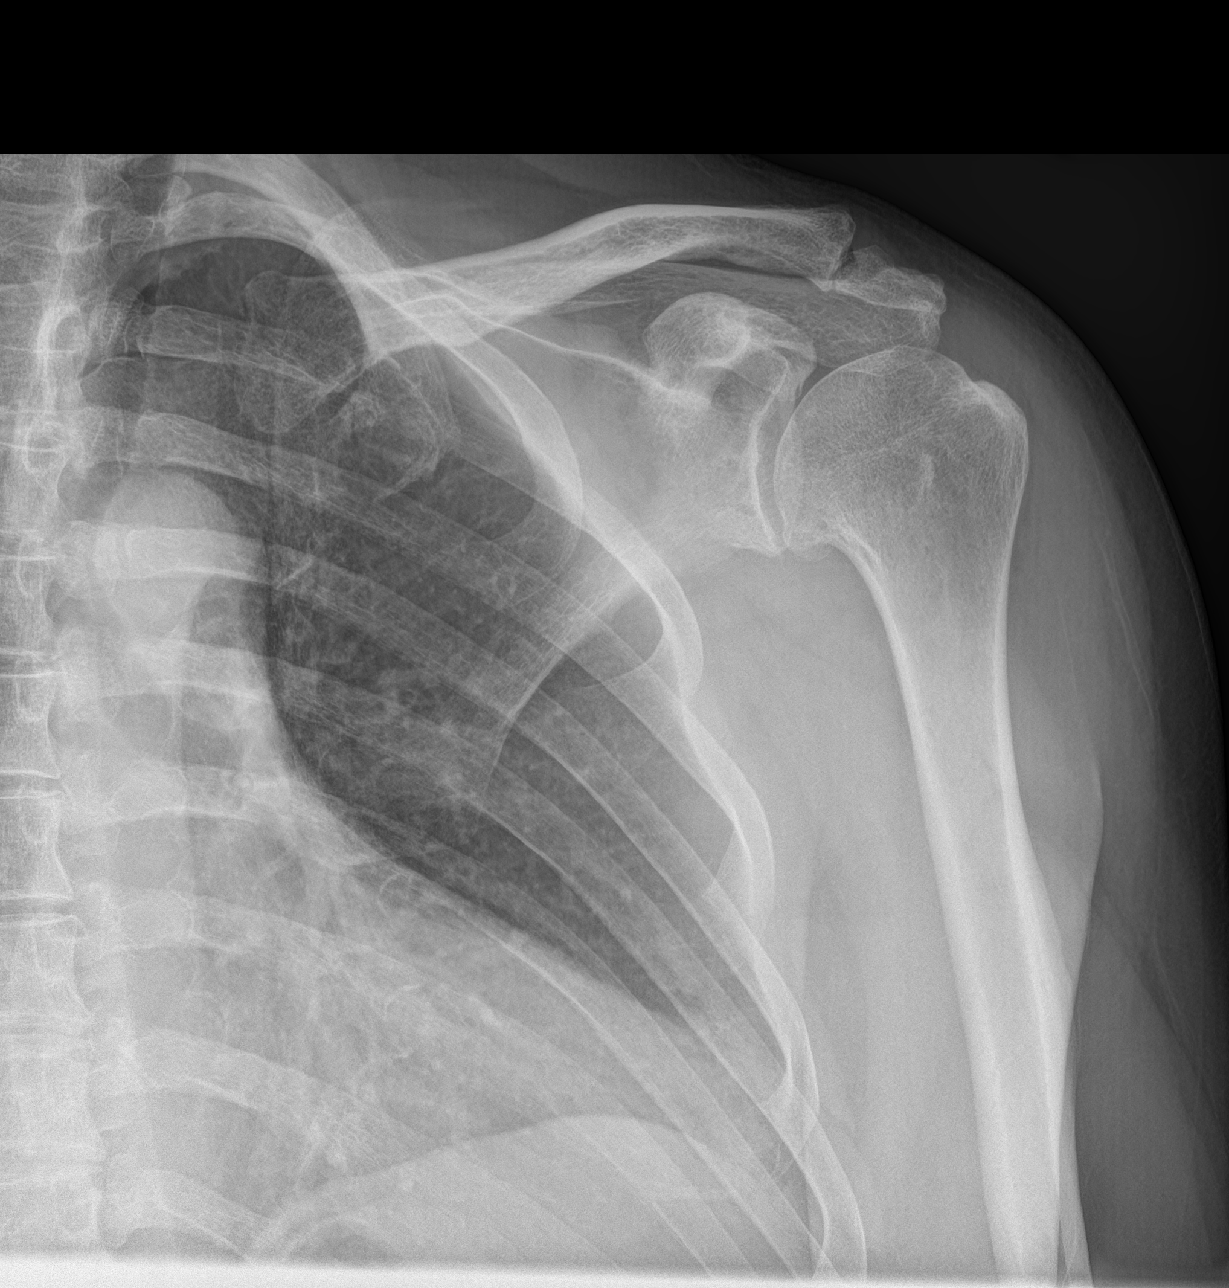
[im 2/4]
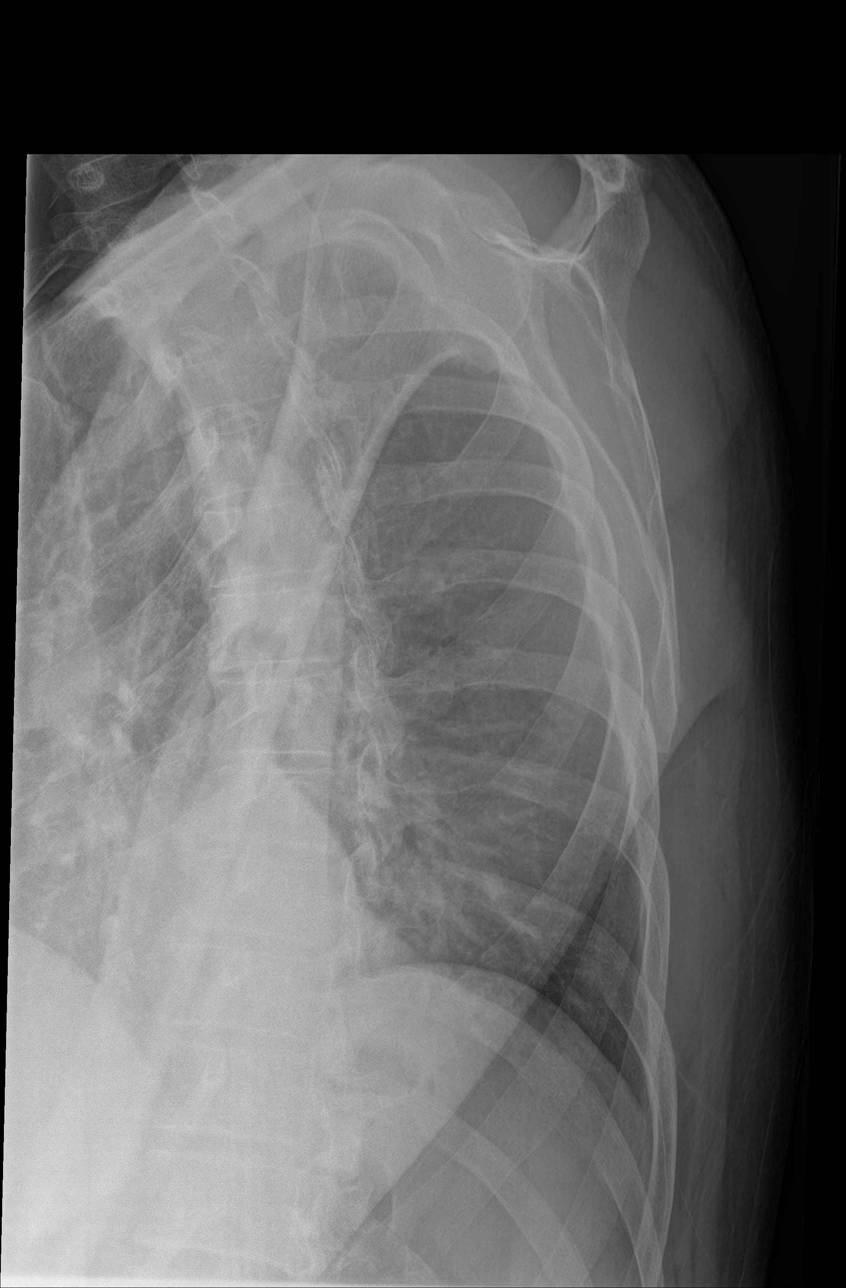
[im 3/4]
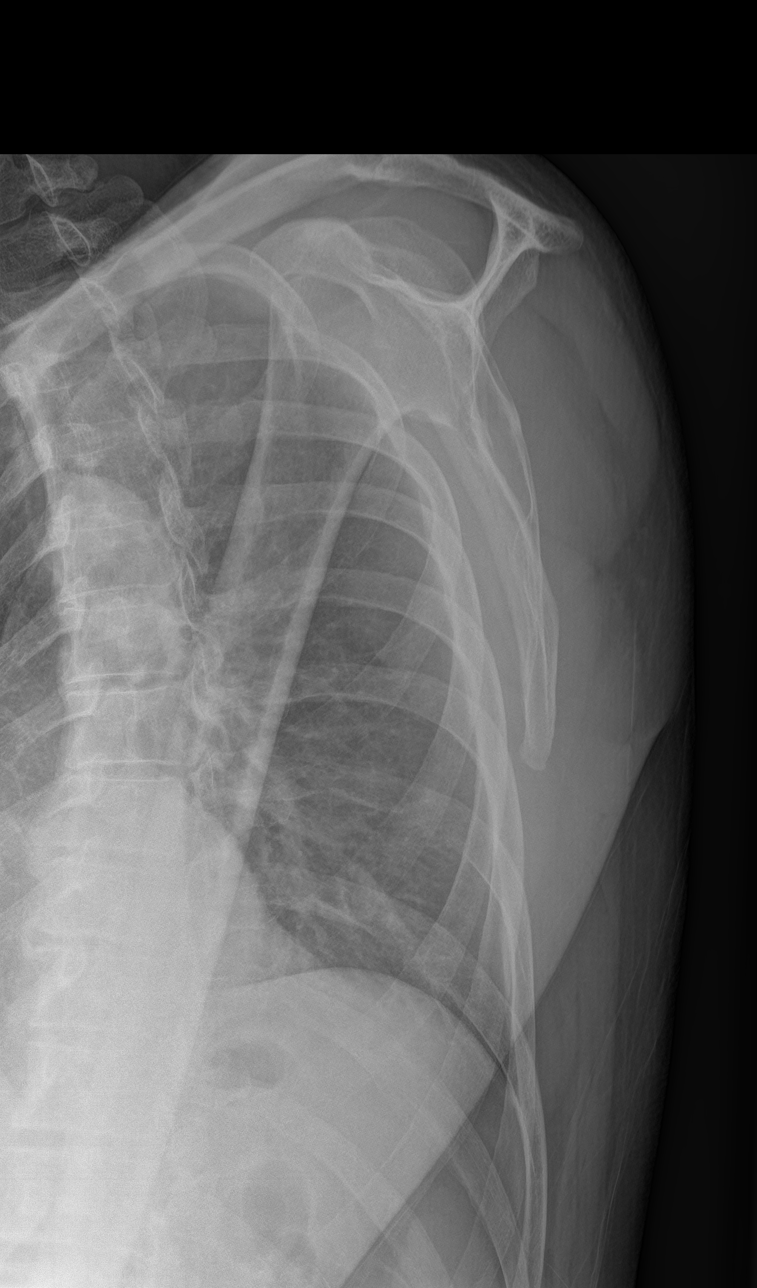
[im 4/4]
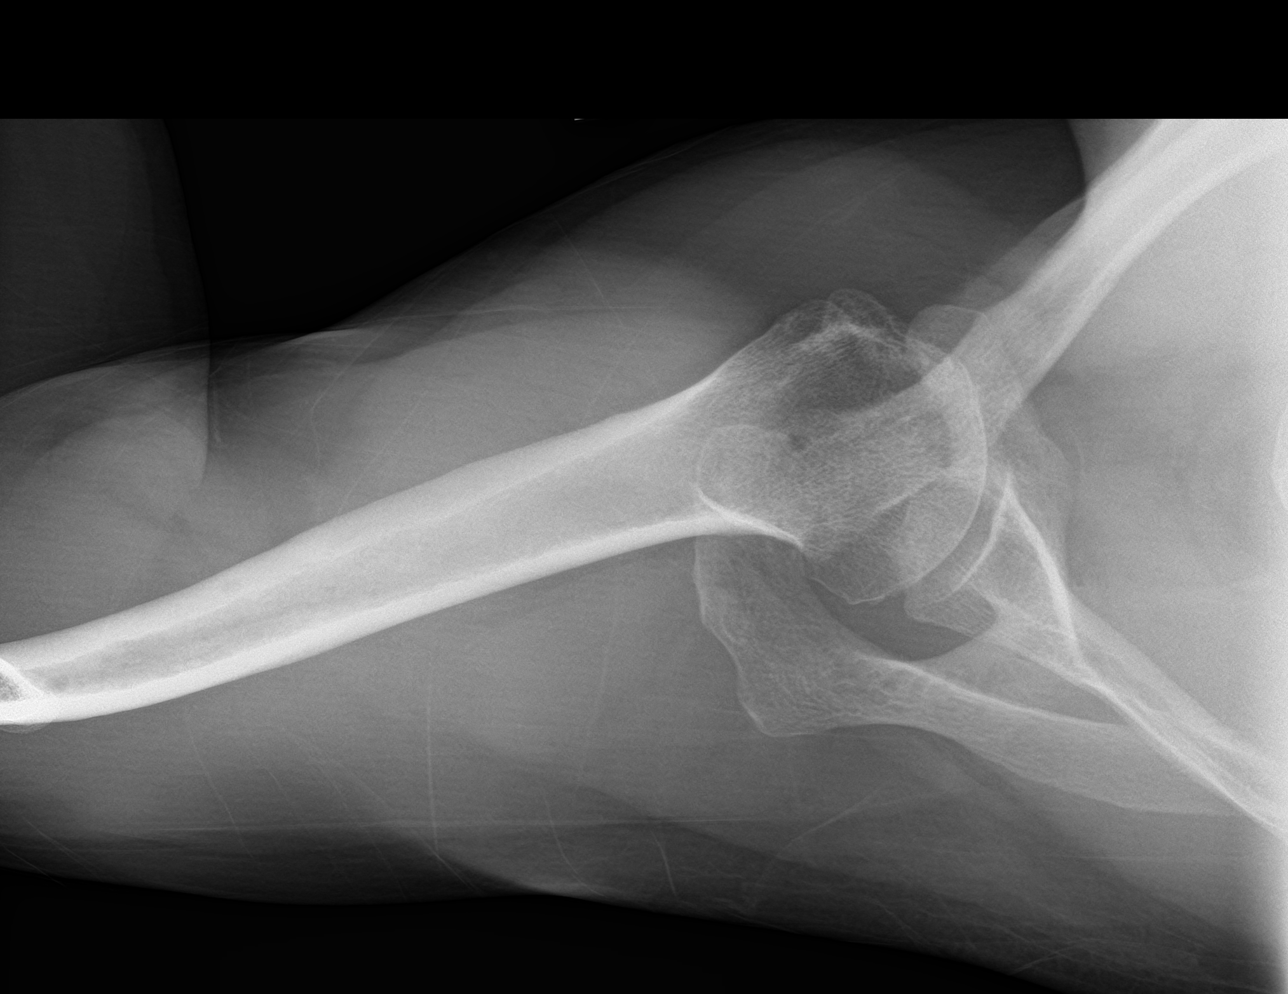

[4 of 4 positions shown; findings below may reference images not displayed]

FINDINGS: No evidence of acute fracture, malalignment or bony lesion.
Degenerative changes are present with mild narrowing of the
glenohumeral joint space, subchondral sclerosis and osteophyte
formation. Similarly, mild degenerative changes are present at the
acromioclavicular joint. The visualized lung is clear. The soft
tissues are unremarkable.
IMPRESSION: 1. Mild glenohumeral and acromioclavicular joint osteoarthritis.
2. No evidence of acute fracture or malalignment.

## 2020-11-12 ENCOUNTER — Encounter: Payer: Self-pay | Admitting: Urology

## 2020-11-12 ENCOUNTER — Ambulatory Visit (INDEPENDENT_AMBULATORY_CARE_PROVIDER_SITE_OTHER): Payer: BC Managed Care – PPO | Admitting: Urology

## 2020-11-12 ENCOUNTER — Other Ambulatory Visit: Payer: Self-pay

## 2020-11-12 VITALS — BP 139/87 | HR 94 | Ht 67.0 in | Wt 198.0 lb

## 2020-11-12 DIAGNOSIS — E291 Testicular hypofunction: Secondary | ICD-10-CM

## 2020-11-12 NOTE — Progress Notes (Signed)
11/12/2020 1:52 PM   Phillip Santana 09-24-1971 [id number]  Referring provider: Margaretann Loveless, PA-C [address] [place],  [place] [address]  Chief Complaint  Patient presents with  . Hypogonadism    HPI: 50 y.o. male referred for hypogonadism.   Long history depression  Recent visit with PCP complaining of significant decreased libido, mild ED and feels his penis is smaller in size and retracting  Denies prior history of TRT.  Testosterone level in 2016 was >1500  Testosterone level 06/2019 low normal at 266 ng/dL with free testosterone low at 3.9 pg/mL  Increased weight gain recently ~10 pounds  No significant tiredness or fatigue  Prior history of torsion with detorsion/orchidopexy   PMH: History reviewed. No pertinent past medical history.  Surgical History: Past Surgical History:  Procedure Laterality Date  . KNEE SURGERY Left 2009  . ROTATOR CUFF REPAIR Right 2005  . SHOULDER SURGERY    . TESTICLE TORSION REDUCTION  1985    Home Medications:  Allergies as of 11/12/2020   No Known Allergies     Medication List       Accurate as of November 12, 2020  1:52 PM. If you have any questions, ask your nurse or doctor.        albuterol 108 (90 Base) MCG/ACT inhaler Commonly known as: VENTOLIN HFA Inhale 2 puffs into the lungs every 6 (six) hours as needed for wheezing or shortness of breath.   azelastine 0.1 % nasal spray Commonly known as: ASTELIN Place 2 sprays into both nostrils 2 (two) times daily. Use in each nostril as directed   Breo Ellipta 200-25 MCG/INH Aepb Generic drug: fluticasone furoate-vilanterol INHALE 1 PUFF INTO THE LUNGS ONCE DAILY   fluticasone 50 MCG/ACT nasal spray Commonly known as: FLONASE Place 2 sprays into both nostrils daily.   ibuprofen 800 MG tablet Commonly known as: ADVIL Take 1 tablet (800 mg total) by mouth every 8 (eight) hours as needed.   Lysine 1000 MG Tabs Take 1 tablet by mouth daily.    MUCINEX ALLERGY PO Take by mouth.   multivitamin capsule Take 1 capsule by mouth daily.   potassium chloride 8 MEQ tablet Commonly known as: KLOR-CON Take 8 mEq by mouth daily.       Allergies: No Known Allergies  Family History: Family History  Problem Relation Age of Onset  . Headache Mother   . Bipolar disorder Father   . Dementia Paternal Grandmother   . Coronary artery disease Paternal Grandfather     Social History:  reports that he has never smoked. He has never used smokeless tobacco. He reports current alcohol use of about 12.0 standard drinks of alcohol per week. He reports that he does not use drugs.   Physical Exam: BP 139/87   Pulse 94   Ht 5\' 7"  (1.702 m)   Wt 198 lb (89.8 kg)   BMI 31.01 kg/m   Constitutional:  Alert and oriented, No acute distress. HEENT:  AT, moist mucus membranes.  Trachea midline, no masses. Cardiovascular: No clubbing, cyanosis, or edema. Respiratory: Normal respiratory effort, no increased work of breathing. GU: Phallus circumcised without lesions, SPL normal, no penile retraction.  Testes descended bilaterally without masses or tenderness, normal size bilaterally Skin: No rashes, bruises or suspicious lesions. Neurologic: Grossly intact, no focal deficits, moving all 4 extremities. Psychiatric: Normal mood and affect.  Laboratory Data:  Lab Results  Component Value Date   TESTOSTERONE 266 06/13/2019    Assessment &  Plan:    1.  Hypogonadism  Primary symptoms decreased libido and depression  Normal exam external genitalia  Testosterone level 2020 low normal and free testosterone level was low  We discussed to establish a diagnosis of hypogonadism he needs to a.m. abnormal testosterone levels and lab visit scheduled for repeat free/total testosterone and LH/prolactin  Potential side effects of testosterone replacement were discussed including stimulation of benign prostatic growth with lower urinary tract symptoms;  erythrocytosis; edema; gynecomastia; worsening sleep apnea; venous thromboembolism; testicular atrophy and infertility. Recent studies suggesting an increased incidence of heart attack and stroke in patients taking testosterone was discussed. He was informed there is conflicting evidence regarding the impact of testosterone therapy on cardiovascular risk. The theoretical risk of growth stimulation of an undetected prostate cancer was also discussed.  He was informed that current evidence does not provide any definitive answers regarding the risks of testosterone therapy on prostate cancer and cardiovascular disease. The need for periodic monitoring of his testosterone level, PSA, hematocrit and DRE was discussed.    Riki Altes, MD  [facility] [address] [place], [place] [address] [phone]

## 2020-11-14 ENCOUNTER — Encounter: Payer: Self-pay | Admitting: Urology

## 2020-11-18 ENCOUNTER — Other Ambulatory Visit: Payer: Self-pay

## 2020-11-18 DIAGNOSIS — E291 Testicular hypofunction: Secondary | ICD-10-CM

## 2020-11-19 ENCOUNTER — Other Ambulatory Visit: Payer: BC Managed Care – PPO

## 2020-11-19 ENCOUNTER — Other Ambulatory Visit: Payer: Self-pay

## 2020-11-19 DIAGNOSIS — E291 Testicular hypofunction: Secondary | ICD-10-CM

## 2020-11-20 LAB — PROLACTIN: Prolactin: 22.6 ng/mL — ABNORMAL HIGH (ref 4.0–15.2)

## 2020-11-20 LAB — TESTOSTERONE: Testosterone: 346 ng/dL (ref 264–916)

## 2020-11-20 LAB — LUTEINIZING HORMONE: LH: 4.5 m[IU]/mL (ref 1.7–8.6)

## 2020-11-22 ENCOUNTER — Telehealth: Payer: Self-pay | Admitting: *Deleted

## 2020-11-22 ENCOUNTER — Telehealth: Payer: Self-pay

## 2020-11-22 NOTE — Telephone Encounter (Signed)
Copied from CRM [id number]. Topic: General - Other >> Nov 22, 2020  4:42 PM Gwenlyn Fudge wrote: Reason for CRM: Pts wife called and is requesting to have a nurse give a call back to go over lab results.

## 2020-11-22 NOTE — Telephone Encounter (Signed)
Labs were ordered by [facility]. I advised pt to give them a call.    Thanks,   -Vernona Rieger

## 2020-11-22 NOTE — Telephone Encounter (Signed)
Added test on

## 2020-11-22 NOTE — Telephone Encounter (Signed)
-----   Message from Riki Altes, MD sent at 11/22/2020  7:30 AM EST ----- Total testosterone was low normal.  Please add on a free testosterone.  LH was normal and prolactin slightly elevated which can be monitored.

## 2020-11-23 NOTE — Telephone Encounter (Signed)
Notified patient as instructed, patient pleased °

## 2020-11-24 ENCOUNTER — Telehealth: Payer: Self-pay | Admitting: *Deleted

## 2020-11-24 LAB — TESTOSTERONE FREE, PROFILE I
Albumin: 4.1 g/dL (ref 4.0–5.0)
Sex Hormone Binding: 37.6 nmol/L (ref 16.5–55.9)
Testost., Free, Calc: 77 pg/mL (ref 30.3–183.2)
Testosterone: 401 ng/dL (ref 264–916)

## 2020-11-24 LAB — SPECIMEN STATUS REPORT

## 2020-11-24 NOTE — Telephone Encounter (Signed)
Patient called triage line would like to know lab results of add on test. Please advise

## 2020-11-25 NOTE — Telephone Encounter (Signed)
The level is not back yet

## 2020-11-25 NOTE — Telephone Encounter (Signed)
Patient notified-will be called once results are back and reviewed. Voiced understanding.

## 2020-11-26 ENCOUNTER — Other Ambulatory Visit: Payer: Self-pay | Admitting: Urology

## 2020-11-26 ENCOUNTER — Encounter: Payer: Self-pay | Admitting: *Deleted

## 2020-11-26 DIAGNOSIS — R6882 Decreased libido: Secondary | ICD-10-CM

## 2020-11-26 DIAGNOSIS — E221 Hyperprolactinemia: Secondary | ICD-10-CM

## 2020-11-29 ENCOUNTER — Telehealth: Payer: Self-pay | Admitting: *Deleted

## 2020-11-29 NOTE — Telephone Encounter (Signed)
Talked with patient today about his testosterone level and endocrinology evaluation since he did have an elevated prolactin level. Referral is in

## 2021-01-10 DIAGNOSIS — M531 Cervicobrachial syndrome: Secondary | ICD-10-CM | POA: Diagnosis not present

## 2021-01-10 DIAGNOSIS — M9901 Segmental and somatic dysfunction of cervical region: Secondary | ICD-10-CM | POA: Diagnosis not present

## 2021-01-10 DIAGNOSIS — M9903 Segmental and somatic dysfunction of lumbar region: Secondary | ICD-10-CM | POA: Diagnosis not present

## 2021-01-10 DIAGNOSIS — M5411 Radiculopathy, occipito-atlanto-axial region: Secondary | ICD-10-CM | POA: Diagnosis not present

## 2021-01-18 DIAGNOSIS — M1611 Unilateral primary osteoarthritis, right hip: Secondary | ICD-10-CM | POA: Diagnosis not present

## 2021-01-24 DIAGNOSIS — M1611 Unilateral primary osteoarthritis, right hip: Secondary | ICD-10-CM | POA: Diagnosis not present

## 2021-01-26 ENCOUNTER — Other Ambulatory Visit: Payer: Self-pay

## 2021-01-26 ENCOUNTER — Encounter: Payer: Self-pay | Admitting: Endocrinology

## 2021-01-26 ENCOUNTER — Ambulatory Visit (INDEPENDENT_AMBULATORY_CARE_PROVIDER_SITE_OTHER): Payer: BC Managed Care – PPO | Admitting: Endocrinology

## 2021-01-26 DIAGNOSIS — E221 Hyperprolactinemia: Secondary | ICD-10-CM | POA: Insufficient documentation

## 2021-01-26 DIAGNOSIS — N201 Calculus of ureter: Secondary | ICD-10-CM

## 2021-01-26 DIAGNOSIS — N209 Urinary calculus, unspecified: Secondary | ICD-10-CM | POA: Insufficient documentation

## 2021-01-26 LAB — TSH: TSH: 1.36 u[IU]/mL (ref 0.35–4.50)

## 2021-01-26 NOTE — Progress Notes (Signed)
Subjective:    Patient ID: Phillip Santana, male    DOB: 09/25/1971, 50 y.o.   MRN: [id number]  HPI Pt is referred by Dr Lonna Cobb, for hyperprolactinemia.  he was noted to have an elevated prolactin in 2022.  he denies the following: excessive exercise, opiates, antipsychotics, brain XRT, brain surgery, cirrhosis, thyroid dz, seizures, infertility, seizures, renal dz, zoster, brain injury, or chest wall injury.  He reports ED sxs and decreased ejaculation.  He has 2 biological children.  He had urolithiasis in 2021.   No past medical history on file.  Past Surgical History:  Procedure Laterality Date  . KNEE SURGERY Left 2009  . ROTATOR CUFF REPAIR Right 2005  . SHOULDER SURGERY    . TESTICLE TORSION REDUCTION  1985    Social History   Socioeconomic History  . Marital status: Married    Spouse name: Not on file  . Number of children: Not on file  . Years of education: Not on file  . Highest education level: Not on file  Occupational History  . Not on file  Tobacco Use  . Smoking status: Never Smoker  . Smokeless tobacco: Never Used  Substance and Sexual Activity  . Alcohol use: Yes    Alcohol/week: 12.0 standard drinks    Types: 12 Cans of beer per week    Comment: occasionally  . Drug use: No  . Sexual activity: Yes  Other Topics Concern  . Not on file  Social History Narrative  . Not on file   Social Determinants of Health   Financial Resource Strain: Not on file  Food Insecurity: Not on file  Transportation Needs: Not on file  Physical Activity: Not on file  Stress: Not on file  Social Connections: Not on file  Intimate Partner Violence: Not on file    Current Outpatient Medications on File Prior to Visit  Medication Sig Dispense Refill  . albuterol (VENTOLIN HFA) 108 (90 Base) MCG/ACT inhaler Inhale 2 puffs into the lungs every 6 (six) hours as needed for wheezing or shortness of breath. 18 g 5  . azelastine (ASTELIN) 0.1 % nasal spray Place 2 sprays into both  nostrils 2 (two) times daily. Use in each nostril as directed 30 mL 12  . BREO ELLIPTA 200-25 MCG/INH AEPB INHALE 1 PUFF INTO THE LUNGS ONCE DAILY 60 each 1  . Fexofenadine HCl (MUCINEX ALLERGY PO) Take by mouth.    . fluticasone (FLONASE) 50 MCG/ACT nasal spray Place 2 sprays into both nostrils daily. 48 g 1  . ibuprofen (ADVIL,MOTRIN) 800 MG tablet Take 1 tablet (800 mg total) by mouth every 8 (eight) hours as needed. 90 tablet 1  . Lysine 1000 MG TABS Take 1 tablet by mouth daily.    . Multiple Vitamin (MULTIVITAMIN) capsule Take 1 capsule by mouth daily.    . potassium chloride (KLOR-CON) 8 MEQ tablet Take 8 mEq by mouth daily.     No current facility-administered medications on file prior to visit.    No Known Allergies  Family History  Problem Relation Age of Onset  . Headache Mother   . Bipolar disorder Father   . Dementia Paternal Grandmother   . Coronary artery disease Paternal Grandfather   . Other Neg Hx        pituitary disorder    BP 136/80 (BP Location: Right Arm, Patient Position: Sitting, Cuff Size: Large)   Pulse 84   Ht 5\' 7"  (1.702 m)   Wt 197 lb (89.4  kg)   SpO2 96%   BMI 30.85 kg/m    Review of Systems denies nocturia, headache, weight change, and n/v.     Objective:   Physical Exam VITAL SIGNS:  See vs page GENERAL: no distress NECK: There is no palpable thyroid enlargement.  No thyroid nodule is palpable.  No palpable lymphadenopathy at the anterior neck. BREASTS: no gynecomastia.   GENITALIA: Normal male testicles, scrotum, and penis.   Prolactin=22  Lab Results  Component Value Date   TSH 0.929 06/13/2019    Lab Results  Component Value Date   TESTOSTERONE 346 11/19/2020   TESTOSTERONE 401 11/19/2020   MRI (2009): normal pituitary  I have reviewed outside records, and summarized: Pt was noted to have elevated prolactin, and referred here. Main urologic symptom reported was penile retraction.        Assessment & Plan:   Hyperprolactinemia, new to me.  uncertain etiology and prognosis. Depression: check TFT.  Blood tests are requested for you today.  We'll let you know about the results.

## 2021-01-27 DIAGNOSIS — Z7982 Long term (current) use of aspirin: Secondary | ICD-10-CM | POA: Diagnosis not present

## 2021-01-27 DIAGNOSIS — R519 Headache, unspecified: Secondary | ICD-10-CM | POA: Diagnosis not present

## 2021-01-27 DIAGNOSIS — N390 Urinary tract infection, site not specified: Secondary | ICD-10-CM | POA: Diagnosis not present

## 2021-01-27 DIAGNOSIS — G43909 Migraine, unspecified, not intractable, without status migrainosus: Secondary | ICD-10-CM | POA: Diagnosis not present

## 2021-01-27 LAB — PROLACTIN: Prolactin: 9.5 ng/mL (ref 2.0–18.0)

## 2021-01-27 LAB — PTH, INTACT AND CALCIUM
Calcium: 9.5 mg/dL (ref 8.6–10.3)
PTH: 30 pg/mL (ref 16–77)

## 2021-01-27 LAB — T4, FREE: Free T4: 0.64 ng/dL (ref 0.60–1.60)

## 2021-01-31 ENCOUNTER — Telehealth: Payer: Self-pay

## 2021-01-31 NOTE — Telephone Encounter (Signed)
Copied from CRM [id number]. Topic: General - Other >> Jan 31, 2021 11:41 AM Gwenlyn Fudge wrote: Reason for CRM: Pts wife called stating that the pt will be having a hip replacement on 03/21/21. She states that the specialist will be sending over a fax to have a surgical clearance filled out. Please advise.  Former patient of [facility].

## 2021-02-18 ENCOUNTER — Ambulatory Visit (INDEPENDENT_AMBULATORY_CARE_PROVIDER_SITE_OTHER): Payer: BC Managed Care – PPO | Admitting: Family Medicine

## 2021-02-18 ENCOUNTER — Encounter: Payer: Self-pay | Admitting: Family Medicine

## 2021-02-18 ENCOUNTER — Other Ambulatory Visit: Payer: Self-pay

## 2021-02-18 VITALS — BP 117/83 | HR 78 | Temp 98.3°F | Wt 195.0 lb

## 2021-02-18 DIAGNOSIS — M1611 Unilateral primary osteoarthritis, right hip: Secondary | ICD-10-CM

## 2021-02-18 DIAGNOSIS — Z01818 Encounter for other preprocedural examination: Secondary | ICD-10-CM | POA: Diagnosis not present

## 2021-02-18 NOTE — Progress Notes (Signed)
Established patient visit   Patient: Phillip Santana   DOB: August 05, 1971   50 y.o. Male  MRN: [id number] Visit Date: 02/18/2021  Today's healthcare provider: Dortha Kern, PA-C   Chief Complaint  Patient presents with  . Medical Clearance   Subjective    Arthritis Presents for follow-up visit. He complains of pain. He reports no joint swelling. The symptoms have been worsening. Affected location: Bilateral hands, arms.     Pt is here for surgical clearance.    Patient Active Problem List   Diagnosis Date Noted  . Hyperprolactinemia (HCC) 01/26/2021  . Urolithiasis 01/26/2021  . Seasonal allergic rhinitis due to pollen 04/27/2020  . SOB (shortness of breath) 04/22/2020  . BMI 29.0-29.9,adult 04/22/2020  . Primary osteoarthritis of left knee 05/08/2016  . Lumbar herniated disc 05/08/2016  . Hypercholesterolemia with hypertriglyceridemia 02/01/2016  . Night sweats 09/07/2015  . Depression 04/12/2015  . Alcohol abuse, episodic 03/10/2015  . Anxiety 03/10/2015  . Blood pressure elevated 03/10/2015  . Malaise and fatigue 03/03/2010  . Generalized muscle weakness 03/03/2010  . Visual disturbance 06/23/2008   Past Surgical History:  Procedure Laterality Date  . KNEE SURGERY Left 2009  . ROTATOR CUFF REPAIR Right 2005  . SHOULDER SURGERY    . TESTICLE TORSION REDUCTION  1985   Social History   Tobacco Use  . Smoking status: Never Smoker  . Smokeless tobacco: Never Used  Substance Use Topics  . Alcohol use: Yes    Alcohol/week: 12.0 standard drinks    Types: 12 Cans of beer per week    Comment: occasionally  . Drug use: No   No Known Allergies   Medications: Outpatient Medications Prior to Visit  Medication Sig  . albuterol (VENTOLIN HFA) 108 (90 Base) MCG/ACT inhaler Inhale 2 puffs into the lungs every 6 (six) hours as needed for wheezing or shortness of breath.  Marland Kitchen azelastine (ASTELIN) 0.1 % nasal spray Place 2 sprays into both nostrils 2 (two) times daily.  Use in each nostril as directed  . BREO ELLIPTA 200-25 MCG/INH AEPB INHALE 1 PUFF INTO THE LUNGS ONCE DAILY  . Fexofenadine HCl (MUCINEX ALLERGY PO) Take by mouth.  . fluticasone (FLONASE) 50 MCG/ACT nasal spray Place 2 sprays into both nostrils daily.  Marland Kitchen ibuprofen (ADVIL,MOTRIN) 800 MG tablet Take 1 tablet (800 mg total) by mouth every 8 (eight) hours as needed.  . Multiple Vitamin (MULTIVITAMIN) capsule Take 1 capsule by mouth daily.  . potassium chloride (KLOR-CON) 8 MEQ tablet Take 8 mEq by mouth daily.  Marland Kitchen Lysine 1000 MG TABS Take 1 tablet by mouth daily.   No facility-administered medications prior to visit.    Review of Systems  Constitutional: Negative.   Respiratory: Negative.   Cardiovascular: Negative.   Gastrointestinal: Negative.   Musculoskeletal: Positive for arthralgias, arthritis and myalgias. Negative for back pain, gait problem, joint swelling, neck pain and neck stiffness.  Neurological: Negative for dizziness, light-headedness and headaches.    Objective    BP 117/83 (BP Location: Right Arm, Patient Position: Sitting, Cuff Size: Large)   Pulse 78   Temp 98.3 F (36.8 C) (Oral)   Wt 195 lb (88.5 kg)   SpO2 97%   BMI 30.54 kg/m    Physical Exam Constitutional:      General: He is not in acute distress.    Appearance: He is well-developed.  HENT:     Head: Normocephalic and atraumatic.     Right Ear: Hearing and tympanic membrane  normal.     Left Ear: Hearing and tympanic membrane normal.     Nose: Nose normal.  Eyes:     General: Lids are normal. No scleral icterus.       Right eye: No discharge.        Left eye: No discharge.     Conjunctiva/sclera: Conjunctivae normal.  Cardiovascular:     Rate and Rhythm: Normal rate and regular rhythm.     Pulses: Normal pulses.     Heart sounds: Normal heart sounds.  Pulmonary:     Effort: Pulmonary effort is normal. No respiratory distress.     Breath sounds: Normal breath sounds.  Abdominal:     General:  Bowel sounds are normal.     Palpations: Abdomen is soft.  Musculoskeletal:     Cervical back: Normal range of motion and neck supple.     Comments: Ache and stiffness in the right hip. Unable to cross legs into figure-4 position.  Skin:    Findings: No lesion or rash.  Neurological:     Mental Status: He is alert and oriented to person, place, and time.  Psychiatric:        Speech: Speech normal.        Behavior: Behavior normal.        Thought Content: Thought content normal.       No results found for any visits on 02/18/21.  Assessment & Plan     1. Pre-op evaluation Good general health. Low risk for right hip surgery. Completed surgical clearance certificate. - EKG 12-Lead - CBC with Differential/Platelet - Hemoglobin A1c - INR/PT - Basic Metabolic Panel (BMET)  2. Arthritis of right hip Pain and stiffness in the right hip. Scheduled for right hip replacement by Dr. Charlann Boxer (orthopedic surgeon) on 03-21-21.   No follow-ups on file.      I, Chantay Whitelock, PA-C, have reviewed all documentation for this visit. The documentation on 02/24/21 for the exam, diagnosis, procedures, and orders are all accurate and complete.    Dortha Kern, PA-C  [facility] [phone] (phone) [phone] (fax)  [facility]

## 2021-02-19 LAB — BASIC METABOLIC PANEL
BUN/Creatinine Ratio: 14 (ref 9–20)
BUN: 13 mg/dL (ref 6–24)
CO2: 21 mmol/L (ref 20–29)
Calcium: 9.2 mg/dL (ref 8.7–10.2)
Chloride: 105 mmol/L (ref 96–106)
Creatinine, Ser: 0.9 mg/dL (ref 0.76–1.27)
Glucose: 84 mg/dL (ref 65–99)
Potassium: 4.7 mmol/L (ref 3.5–5.2)
Sodium: 142 mmol/L (ref 134–144)
eGFR: 105 mL/min/{1.73_m2} (ref >59)

## 2021-02-19 LAB — CBC WITH DIFFERENTIAL/PLATELET
Basophils Absolute: 0.1 10*3/uL (ref 0.0–0.2)
Basos: 1 %
EOS (ABSOLUTE): 0.2 10*3/uL (ref 0.0–0.4)
Eos: 4 %
Hematocrit: 47.6 % (ref 37.5–51.0)
Hemoglobin: 16 g/dL (ref 13.0–17.7)
Immature Grans (Abs): 0 10*3/uL (ref 0.0–0.1)
Immature Granulocytes: 0 %
Lymphocytes Absolute: 1.2 10*3/uL (ref 0.7–3.1)
Lymphs: 21 %
MCH: 29.6 pg (ref 26.6–33.0)
MCHC: 33.6 g/dL (ref 31.5–35.7)
MCV: 88 fL (ref 79–97)
Monocytes Absolute: 0.4 10*3/uL (ref 0.1–0.9)
Monocytes: 6 %
Neutrophils Absolute: 4.1 10*3/uL (ref 1.4–7.0)
Neutrophils: 68 %
Platelets: 268 10*3/uL (ref 150–450)
RBC: 5.41 x10E6/uL (ref 4.14–5.80)
RDW: 12.7 % (ref 11.6–15.4)
WBC: 6.1 10*3/uL (ref 3.4–10.8)

## 2021-02-19 LAB — PROTIME-INR
INR: 1 (ref 0.9–1.2)
Prothrombin Time: 10.2 s (ref 9.1–12.0)

## 2021-02-19 LAB — HEMOGLOBIN A1C
Est. average glucose Bld gHb Est-mCnc: 117 mg/dL
Hgb A1c MFr Bld: 5.7 % — ABNORMAL HIGH (ref 4.8–5.6)

## 2021-02-21 ENCOUNTER — Ambulatory Visit: Payer: Self-pay | Admitting: Family Medicine

## 2021-02-24 ENCOUNTER — Encounter: Payer: Self-pay | Admitting: Family Medicine

## 2021-03-14 ENCOUNTER — Telehealth: Payer: Self-pay

## 2021-03-14 NOTE — Telephone Encounter (Signed)
Copied from CRM [id number]. Topic: General - Inquiry >> Mar 14, 2021  9:23 AM Daphine Deutscher D wrote: Reason for CRM: Pt has poison oak on his leg.  He said it is itching really bad.  He said he usually takes prednisone but he is going to have   Medication yet ut may need some.  CN#  [phone]

## 2021-03-14 NOTE — Telephone Encounter (Signed)
Having poison ivy rash and itching the past day. Was concerned about using prednisone for the rash prior to his right hip surgery and contacted his orthopedic surgeon. They prescribed a short course of the prednisone to stop in 2-3 days.

## 2021-03-21 DIAGNOSIS — M1611 Unilateral primary osteoarthritis, right hip: Secondary | ICD-10-CM | POA: Diagnosis not present

## 2021-03-21 HISTORY — PX: TOTAL HIP ARTHROPLASTY: SHX124

## 2021-06-13 DIAGNOSIS — M1712 Unilateral primary osteoarthritis, left knee: Secondary | ICD-10-CM | POA: Diagnosis not present

## 2021-06-13 DIAGNOSIS — Z96641 Presence of right artificial hip joint: Secondary | ICD-10-CM | POA: Diagnosis not present

## 2021-06-13 DIAGNOSIS — Z471 Aftercare following joint replacement surgery: Secondary | ICD-10-CM | POA: Diagnosis not present

## 2021-06-13 DIAGNOSIS — M25562 Pain in left knee: Secondary | ICD-10-CM | POA: Diagnosis not present

## 2021-06-15 ENCOUNTER — Encounter: Payer: Self-pay | Admitting: Family Medicine

## 2021-06-15 ENCOUNTER — Other Ambulatory Visit: Payer: Self-pay

## 2021-06-15 ENCOUNTER — Ambulatory Visit (INDEPENDENT_AMBULATORY_CARE_PROVIDER_SITE_OTHER): Payer: BC Managed Care – PPO | Admitting: Family Medicine

## 2021-06-15 VITALS — BP 123/83 | HR 64 | Temp 97.9°F | Ht 67.0 in | Wt 203.9 lb

## 2021-06-15 DIAGNOSIS — J301 Allergic rhinitis due to pollen: Secondary | ICD-10-CM

## 2021-06-15 DIAGNOSIS — G4709 Other insomnia: Secondary | ICD-10-CM | POA: Diagnosis not present

## 2021-06-15 DIAGNOSIS — F419 Anxiety disorder, unspecified: Secondary | ICD-10-CM | POA: Diagnosis not present

## 2021-06-15 DIAGNOSIS — F1010 Alcohol abuse, uncomplicated: Secondary | ICD-10-CM

## 2021-06-15 MED ORDER — TRAZODONE HCL 100 MG PO TABS: ORAL_TABLET | ORAL | 0 refills | Status: DC

## 2021-06-15 MED ORDER — FLUTICASONE PROPIONATE 50 MCG/ACT NA SUSP: 2.0000 | Freq: Every day | NASAL | 1 refills | Status: AC

## 2021-06-15 NOTE — Assessment & Plan Note (Signed)
Has been drinking up to 7 reported drinks in one evening Noted that ETOH intake has been higher now that he is not working Typically drinks on Saturday when he is with friends/golfing Aware of the concern for quantity

## 2021-06-15 NOTE — Assessment & Plan Note (Signed)
Sleep disturbance sounds like possible anxiety Some report of palpitations at qHS, of note- has been taking albuterol inhaler before sleep, education provided on possible heart rate increase with use Patient also fidgety in office chair

## 2021-06-15 NOTE — Assessment & Plan Note (Addendum)
Pulled for medication refill  Recommend regular use and not right before bed d/t possible drainage

## 2021-06-15 NOTE — Progress Notes (Signed)
Established patient visit   Patient: Phillip Santana   DOB: 03-23-1971   50 y.o. Male  MRN: [id number] Visit Date: 06/15/2021  Today's healthcare provider: Jacky Kindle, FNP   Sleep distrurbances  Subjective    HPI  Patient reports trouble sleeping at night due to breathing issues. Has not gotten any better or worse. Patient states he wouldn't say he is having a shortness of breath but its like his not getting that big breath he is needing.    Medications: Outpatient Medications Prior to Visit  Medication Sig   albuterol (VENTOLIN HFA) 108 (90 Base) MCG/ACT inhaler Inhale 2 puffs into the lungs every 6 (six) hours as needed for wheezing or shortness of breath.   azelastine (ASTELIN) 0.1 % nasal spray Place 2 sprays into both nostrils 2 (two) times daily. Use in each nostril as directed   BREO ELLIPTA 200-25 MCG/INH AEPB INHALE 1 PUFF INTO THE LUNGS ONCE DAILY   Fexofenadine HCl (MUCINEX ALLERGY PO) Take by mouth.   ibuprofen (ADVIL,MOTRIN) 800 MG tablet Take 1 tablet (800 mg total) by mouth every 8 (eight) hours as needed.   Lysine 1000 MG TABS Take 1 tablet by mouth daily.   Multiple Vitamin (MULTIVITAMIN) capsule Take 1 capsule by mouth daily.   [DISCONTINUED] fluticasone (FLONASE) 50 MCG/ACT nasal spray Place 2 sprays into both nostrils daily.   No facility-administered medications prior to visit.    Review of Systems     Objective    BP 123/83 (BP Location: Left Arm, Patient Position: Sitting, Cuff Size: Normal)   Pulse 64   Temp 97.9 F (36.6 C) (Oral)   Ht 5\' 7"  (1.702 m)   Wt 203 lb 14.4 oz (92.5 kg)   BMI 31.94 kg/m  {Show previous vital signs (optional):23777}  Physical Exam Vitals and nursing note reviewed.  Constitutional:      Appearance: Normal appearance. He is obese.  HENT:     Head: Normocephalic and atraumatic.  Eyes:     Pupils: Pupils are equal, round, and reactive to light.  Cardiovascular:     Rate and Rhythm: Normal rate and regular  rhythm.     Pulses: Normal pulses.     Heart sounds: Normal heart sounds.  Pulmonary:     Effort: Pulmonary effort is normal.     Breath sounds: Normal breath sounds.  Abdominal:     General: Bowel sounds are normal.     Palpations: Abdomen is soft.  Musculoskeletal:        General: Normal range of motion.     Cervical back: Normal range of motion.  Skin:    General: Skin is warm and dry.     Capillary Refill: Capillary refill takes less than 2 seconds.  Neurological:     General: No focal deficit present.     Mental Status: He is alert and oriented to person, place, and time. Mental status is at baseline.  Psychiatric:        Mood and Affect: Mood normal.        Behavior: Behavior normal.        Thought Content: Thought content normal.        Judgment: Judgment normal.     No results found for any visits on 06/15/21.  Assessment & Plan     Problem List Items Addressed This Visit       Respiratory   Seasonal allergic rhinitis due to pollen    Pulled for medication  refill      Relevant Medications   fluticasone (FLONASE) 50 MCG/ACT nasal spray     Other   Alcohol abuse, episodic    Has been drinking up to 7 reported drinks in one evening Noted that ETOH intake has been higher now that he is not working Typically drinks on Saturday when he is with friends/golfing Aware of the concern for quantity      Anxiety    Sleep disturbance sounds like possible anxiety Some report of palpitations at qHS, of note- has been taking albuterol inhaler before sleep, education provided on possible heart rate increase with use Patient also fidgety in office chair       Relevant Medications   traZODone (DESYREL) 100 MG tablet   Other insomnia - Primary    Discussed reduced ETOH intake Discussed earlier gym routines Discussed less stress, stimulants (coffee, tea, soda) Discussed better sleep routine (same time to bed, limit blue light, same wake up time, using bed for sex/sleep  alone) Trial of trazodone      Relevant Medications   traZODone (DESYREL) 100 MG tablet     Return if symptoms worsen or fail to improve.      Leilani Merl, FNP, have reviewed all documentation for this visit. The documentation on 06/15/21 for the exam, diagnosis, procedures, and orders are all accurate and complete.    Jacky Kindle, FNP  [facility] [phone] (phone) [phone] (fax)  [facility]

## 2021-06-15 NOTE — Assessment & Plan Note (Signed)
Discussed reduced ETOH intake Discussed earlier gym routines Discussed less stress, stimulants (coffee, tea, soda) Discussed better sleep routine (same time to bed, limit blue light, same wake up time, using bed for sex/sleep alone) Trial of trazodone

## 2021-06-28 ENCOUNTER — Other Ambulatory Visit: Payer: Self-pay | Admitting: Family Medicine

## 2021-06-28 ENCOUNTER — Encounter: Payer: Self-pay | Admitting: Family Medicine

## 2021-06-28 DIAGNOSIS — Z01818 Encounter for other preprocedural examination: Secondary | ICD-10-CM | POA: Insufficient documentation

## 2021-07-18 DIAGNOSIS — M1712 Unilateral primary osteoarthritis, left knee: Secondary | ICD-10-CM | POA: Diagnosis not present

## 2021-07-27 DIAGNOSIS — Z01818 Encounter for other preprocedural examination: Secondary | ICD-10-CM | POA: Diagnosis not present

## 2021-07-28 LAB — BASIC METABOLIC PANEL
BUN/Creatinine Ratio: 13 (ref 9–20)
BUN: 13 mg/dL (ref 6–24)
CO2: 22 mmol/L (ref 20–29)
Calcium: 9.4 mg/dL (ref 8.7–10.2)
Chloride: 104 mmol/L (ref 96–106)
Creatinine, Ser: 1 mg/dL (ref 0.76–1.27)
Glucose: 92 mg/dL (ref 70–99)
Potassium: 4.7 mmol/L (ref 3.5–5.2)
Sodium: 140 mmol/L (ref 134–144)
eGFR: 92 mL/min/{1.73_m2} (ref >59)

## 2021-07-28 LAB — PROTIME-INR
INR: 1 (ref 0.9–1.2)
Prothrombin Time: 10.3 s (ref 9.1–12.0)

## 2021-07-28 LAB — URINALYSIS
Bilirubin, UA: NEGATIVE
Glucose, UA: NEGATIVE
Ketones, UA: NEGATIVE
Leukocytes,UA: NEGATIVE
Nitrite, UA: NEGATIVE
Protein,UA: NEGATIVE
RBC, UA: NEGATIVE
Specific Gravity, UA: 1.018 (ref 1.005–1.030)
Urobilinogen, Ur: 0.2 mg/dL (ref 0.2–1.0)
pH, UA: 5.5 (ref 5.0–7.5)

## 2021-07-28 LAB — CBC
Hematocrit: 49 % (ref 37.5–51.0)
Hemoglobin: 16.1 g/dL (ref 13.0–17.7)
MCH: 28.8 pg (ref 26.6–33.0)
MCHC: 32.9 g/dL (ref 31.5–35.7)
MCV: 88 fL (ref 79–97)
Platelets: 216 10*3/uL (ref 150–450)
RBC: 5.59 x10E6/uL (ref 4.14–5.80)
RDW: 13.1 % (ref 11.6–15.4)
WBC: 4.7 10*3/uL (ref 3.4–10.8)

## 2021-07-28 LAB — HEMOGLOBIN A1C
Est. average glucose Bld gHb Est-mCnc: 120 mg/dL
Hgb A1c MFr Bld: 5.8 % — ABNORMAL HIGH (ref 4.8–5.6)

## 2021-07-28 LAB — ALBUMIN: Albumin: 4.6 g/dL (ref 4.0–5.0)

## 2021-07-28 NOTE — Progress Notes (Signed)
Good luck with your surgery that is upcoming; your pre-diabetes number have gotten a tad worse.  Continue to focus on lifestyle modification and exercise.  Please let us know if you have any questions.  Thank you,  Merita Norton, FNP

## 2021-08-11 DIAGNOSIS — M1712 Unilateral primary osteoarthritis, left knee: Secondary | ICD-10-CM | POA: Diagnosis not present

## 2021-09-28 DIAGNOSIS — Z4789 Encounter for other orthopedic aftercare: Secondary | ICD-10-CM | POA: Diagnosis not present

## 2022-07-13 DIAGNOSIS — G43109 Migraine with aura, not intractable, without status migrainosus: Secondary | ICD-10-CM | POA: Diagnosis not present

## 2022-07-13 DIAGNOSIS — Z01818 Encounter for other preprocedural examination: Secondary | ICD-10-CM | POA: Diagnosis not present

## 2022-07-13 DIAGNOSIS — Z114 Encounter for screening for human immunodeficiency virus [HIV]: Secondary | ICD-10-CM | POA: Diagnosis not present

## 2022-07-13 DIAGNOSIS — Z23 Encounter for immunization: Secondary | ICD-10-CM | POA: Diagnosis not present

## 2022-07-13 DIAGNOSIS — Z1159 Encounter for screening for other viral diseases: Secondary | ICD-10-CM | POA: Diagnosis not present

## 2023-05-09 DIAGNOSIS — M25562 Pain in left knee: Secondary | ICD-10-CM | POA: Diagnosis not present

## 2023-05-09 DIAGNOSIS — M1711 Unilateral primary osteoarthritis, right knee: Secondary | ICD-10-CM | POA: Diagnosis not present

## 2023-05-09 DIAGNOSIS — M25561 Pain in right knee: Secondary | ICD-10-CM | POA: Diagnosis not present

## 2023-05-09 DIAGNOSIS — Z96652 Presence of left artificial knee joint: Secondary | ICD-10-CM | POA: Diagnosis not present

## 2023-08-27 ENCOUNTER — Encounter (INDEPENDENT_AMBULATORY_CARE_PROVIDER_SITE_OTHER): Payer: Self-pay

## 2023-09-07 DIAGNOSIS — M5442 Lumbago with sciatica, left side: Secondary | ICD-10-CM | POA: Diagnosis not present

## 2023-09-07 DIAGNOSIS — M9903 Segmental and somatic dysfunction of lumbar region: Secondary | ICD-10-CM | POA: Diagnosis not present

## 2023-09-11 ENCOUNTER — Encounter: Payer: Self-pay | Admitting: Bariatrics

## 2023-09-11 ENCOUNTER — Ambulatory Visit: Payer: BC Managed Care – PPO | Admitting: Bariatrics

## 2023-09-11 VITALS — BP 133/80 | HR 76 | Temp 97.8°F | Ht 67.0 in | Wt 205.0 lb

## 2023-09-11 DIAGNOSIS — Z0289 Encounter for other administrative examinations: Secondary | ICD-10-CM

## 2023-09-11 DIAGNOSIS — R7303 Prediabetes: Secondary | ICD-10-CM

## 2023-09-11 DIAGNOSIS — E221 Hyperprolactinemia: Secondary | ICD-10-CM

## 2023-09-11 DIAGNOSIS — Z6832 Body mass index (BMI) 32.0-32.9, adult: Secondary | ICD-10-CM | POA: Diagnosis not present

## 2023-09-11 DIAGNOSIS — E669 Obesity, unspecified: Secondary | ICD-10-CM | POA: Diagnosis not present

## 2023-09-11 DIAGNOSIS — E66811 Obesity, class 1: Secondary | ICD-10-CM

## 2023-09-11 NOTE — Progress Notes (Signed)
Office: [phone]  /  Fax: [phone]   Initial Visit  Piedad Climes Proto was seen in clinic today to evaluate for obesity. He is interested in losing weight to improve overall health and reduce the risk of weight related complications. He presents today to review program treatment options, initial physical assessment, and evaluation.     He was referred by: PCP  When asked what else they would like to accomplish? He states: Adopt healthier eating patterns and Improve quality of life  When asked how has your weight affected you? He states: Problems with eating patterns  Some associated conditions: Prediabetes  Contributing factors: Consumption of processed foods, Reduced physical activity, and Eating patterns  Weight promoting medications identified: None  Current nutrition plan: None  Current level of physical activity: Walking and Other: elliptical  Current or previous pharmacotherapy: Is interested in pharmacotherapy and GLP-1  Response to medication: Had side effects so it was discontinued   Past medical history includes:   Past Medical History:  Diagnosis Date   Hypercholesterolemia with hypertriglyceridemia 02/01/2016     Objective:   BP 133/80   Pulse 76   Temp 97.8 F (36.6 C)   Ht 5\' 7"  (1.702 m)   Wt 205 lb (93 kg)   SpO2 98%   BMI 32.11 kg/m  He was weighed on the bioimpedance scale: Body mass index is 32.11 kg/m.  Peak Weight:211 lbs , Body Fat%:30.2 %, Visceral Fat Rating:15, Weight trend over the last 12 months: Increasing  General:  Alert, oriented and cooperative. Patient is in no acute distress.  Respiratory: Normal respiratory effort, no problems with respiration noted  Extremities: Normal range of motion.    Mental Status: Normal mood and affect. Normal behavior. Normal judgment and thought content.   DIAGNOSTIC DATA REVIEWED:  BMET    Component Value Date/Time   NA 140 07/27/2021 1005   K 4.7 07/27/2021 1005   CL 104 07/27/2021 1005    CO2 22 07/27/2021 1005   GLUCOSE 92 07/27/2021 1005   BUN 13 07/27/2021 1005   CREATININE 1.00 07/27/2021 1005   CALCIUM 9.4 07/27/2021 1005   GFRNONAA 71 06/13/2019 1001   GFRAA 82 06/13/2019 1001   Lab Results  Component Value Date   HGBA1C 5.8 (H) 07/27/2021   HGBA1C 5.7 (H) 02/18/2021   No results found for: "INSULIN" CBC    Component Value Date/Time   WBC 4.7 07/27/2021 1005   RBC 5.59 07/27/2021 1005   HGB 16.1 07/27/2021 1005   HCT 49.0 07/27/2021 1005   PLT 216 07/27/2021 1005   MCV 88 07/27/2021 1005   MCH 28.8 07/27/2021 1005   MCHC 32.9 07/27/2021 1005   RDW 13.1 07/27/2021 1005   Iron/TIBC/Ferritin/ %Sat No results found for: "IRON", "TIBC", "FERRITIN", "IRONPCTSAT" Lipid Panel  No results found for: "CHOL", "TRIG", "HDL", "CHOLHDL", "VLDL", "LDLCALC", "LDLDIRECT" Hepatic Function Panel     Component Value Date/Time   PROT 7.6 06/13/2019 1001   ALBUMIN 4.6 07/27/2021 1005   AST 29 06/13/2019 1001   ALT 28 06/13/2019 1001   ALKPHOS 88 06/13/2019 1001   BILITOT 0.3 06/13/2019 1001      Component Value Date/Time   TSH 1.36 01/26/2021 1409     Assessment and Plan:   Prediabetes Last A1c was 5.8  Medication(s): Had been on Wegovy in the past.   Lab Results  Component Value Date   HGBA1C 5.8 (H) 07/27/2021   HGBA1C 5.7 (H) 02/18/2021   No results found for: "INSULIN"  Plan: Will minimize all refined carbohydrates both sweets and starches.  Will work on the plan and exercise.  Consider both aerobic and resistance training.  Will keep protein, water, and fiber intake high.  Increase Polyunsaturated and Monounsaturated fats to increase satiety and encourage weight loss.  May consider another GLP-1 in the future.  Will need to check B 12, electrolytes, and magnesium.   Hyperprolactinemia:   His prolactin was high in the past and he does not remember this, or any follow-up.   Plan: Will check his prolactin in the past.  Will need to check B  12, electrolytes, and magnesium for cramping and paresthesias.    Generalized Obesity: Current BMI 32    Obesity Treatment / Action Plan:  Patient will work on garnering support from family and friends to begin weight loss journey. Will work on eliminating or reducing the presence of highly palatable, calorie dense foods in the home. Will complete provided nutritional and psychosocial assessment questionnaire before the next appointment. Will be scheduled for indirect calorimetry to determine resting energy expenditure in a fasting state.  This will allow [place] to create a reduced calorie, high-protein meal plan to promote loss of fat mass while preserving muscle mass. Was counseled on nutritional approaches to weight loss and benefits of reducing processed foods and consuming plant-based foods and high quality protein as part of nutritional weight management. Was counseled on pharmacotherapy and role as an adjunct in weight management.   Obesity Education Performed Today:  He was weighed on the bioimpedance scale and results were discussed and documented in the synopsis.  We discussed obesity as a disease and the importance of a more detailed evaluation of all the factors contributing to the disease.  We discussed the importance of long term lifestyle changes which include nutrition, exercise and behavioral modifications as well as the importance of customizing this to his specific health and social needs.  We discussed the benefits of reaching a healthier weight to alleviate the symptoms of existing conditions and reduce the risks of the biomechanical, metabolic and psychological effects of obesity.  Discussed New Patient/Late Arrival, and Cancellation Policies. Patient voiced understanding and allowed to ask questions.   Keedan Parten Schor appears to be in the action stage of change and states they are ready to start intensive lifestyle modifications and behavioral modifications.  30 minutes was  spent today on this visit including the above counseling, pre-visit chart review, and post-visit documentation.  Reviewed by clinician on day of visit: allergies, medications, problem list, medical history, surgical history, family history, social history, and previous encounter notes.    Roseline Ebarb A. Lorretta HarpO.

## 2023-09-20 ENCOUNTER — Encounter: Payer: Self-pay | Admitting: Bariatrics

## 2023-09-20 ENCOUNTER — Ambulatory Visit: Payer: BC Managed Care – PPO | Admitting: Bariatrics

## 2023-09-20 VITALS — BP 144/92 | HR 71 | Temp 97.4°F | Ht 67.0 in | Wt 207.0 lb

## 2023-09-20 DIAGNOSIS — E669 Obesity, unspecified: Secondary | ICD-10-CM

## 2023-09-20 DIAGNOSIS — Z6832 Body mass index (BMI) 32.0-32.9, adult: Secondary | ICD-10-CM

## 2023-09-20 DIAGNOSIS — E221 Hyperprolactinemia: Secondary | ICD-10-CM

## 2023-09-20 DIAGNOSIS — E559 Vitamin D deficiency, unspecified: Secondary | ICD-10-CM

## 2023-09-20 DIAGNOSIS — Z0000 Encounter for general adult medical examination without abnormal findings: Secondary | ICD-10-CM

## 2023-09-20 DIAGNOSIS — R252 Cramp and spasm: Secondary | ICD-10-CM | POA: Diagnosis not present

## 2023-09-20 DIAGNOSIS — R7303 Prediabetes: Secondary | ICD-10-CM

## 2023-09-20 DIAGNOSIS — E538 Deficiency of other specified B group vitamins: Secondary | ICD-10-CM

## 2023-09-20 DIAGNOSIS — R0602 Shortness of breath: Secondary | ICD-10-CM | POA: Diagnosis not present

## 2023-09-20 DIAGNOSIS — R5383 Other fatigue: Secondary | ICD-10-CM | POA: Diagnosis not present

## 2023-09-20 DIAGNOSIS — Z1331 Encounter for screening for depression: Secondary | ICD-10-CM | POA: Diagnosis not present

## 2023-09-20 NOTE — Progress Notes (Signed)
At a Glance:  Vitals Temp: (!) 97.4 F (36.3 C) BP: (!) 144/92 Pulse Rate: 71 SpO2: 96 %   Anthropometric Measurements Height: 5\' 7"  (1.702 m) Weight: 207 lb (93.9 kg) BMI (Calculated): 32.41 Starting Weight: 207lb   Body Composition  Body Fat %: 31.3 % Fat Mass (lbs): 64.8 lbs Muscle Mass (lbs): 135.4 lbs Total Body Water (lbs): 98.2 lbs Visceral Fat Rating : 16   Other Clinical Data Fasting: yes Labs: yes Today's Visit #: 1 Starting Date: 09/20/23    EKG: Normal sinus rhythm, rate 64.   Indirect Calorimeter:   Resting Metabolic Rate ( RMR):  RMR (actual): 2347 kcal RMR (calculated):1911 kcal The calculated basal metabolic rate is 3329 kcal thus his basal metabolic rate is better than expected.  Plan:   Indirect calorimeter completed, interpreted and reviewed with patient today and allowed to ask questions.  Discussed the implications for the chosen plan and exercise based on the RMR reading.  Will consider repeating the RMR in the future based on weight loss.    Chief Complaint:  Obesity   Subjective:  Phillip Santana (MR# [id number]) is a 52 y.o. male who presents for evaluation and treatment of obesity and related comorbidities.   Phillip Santana is currently in the action stage of change and ready to dedicate time achieving and maintaining a healthier weight. Phillip Santana is interested in becoming our patient and working on intensive lifestyle modifications including (but not limited to) diet and exercise for weight loss.  Phillip Santana has been struggling with his weight. He has been unsuccessful in either losing weight, maintaining weight loss, or reaching his healthy weight goal.  Phillip Santana's habits were reviewed today and are as follows: His family eats meals together, he thinks his family will eat healthier with him, he started gaining weight after he changed jobs, he snacks frequently in the evenings, he has problems with excessive hunger, and he frequently eats larger  portions than normal.   He started gaining weight last year. He had changed this job at that time.   Current or previous pharmacotherapy: Is interested in pharmacotherapy, specifically GLP-1. He had tried [place] and barely had some stomach pain.   Response to medication: Had side effects so it was discontinued  Other Fatigue Phillip Santana admits to daytime somnolence and admits to waking up still tired. Patient has a history of symptoms of daytime fatigue. Phillip Santana generally gets 5 or 6 hours of sleep per night, and states that he has no significant issues. Snoring is present. Apneic episodes are not present. Epworth Sleepiness Score is 19.   Shortness of Breath Phillip Santana notes increasing shortness of breath with exercising and seems to be worsening over time with weight gain. He notes getting out of breath sooner with activity than he used to. This has not gotten worse recently. Phillip Santana denies shortness of breath at rest or orthopnea.  Depression Screen Phillip Santana's Food and Mood (modified PHQ-9) score was 15.. 15-19 moderate severe depression     06/15/2021   10:55 AM  Depression screen PHQ 2/9  Decreased Interest 0  Down, Depressed, Hopeless 0  PHQ - 2 Score 0  Altered sleeping 2  Tired, decreased energy 0  Change in appetite 3  Feeling bad or failure about yourself  0  Trouble concentrating 0  Moving slowly or fidgety/restless 0  Suicidal thoughts 0  PHQ-9 Score 5  Difficult doing work/chores Not difficult at all     Assessment and Plan:   Other Fatigue Phillip Santana does feel  that his weight is causing his energy to be lower than it should be. Fatigue may be related to obesity, depression or many other causes. Labs will be ordered, and in the meanwhile, [place] will focus on self care including making healthy food choices, increasing physical activity and focusing on stress reduction.  Shortness of Breath Phillip Santana does not feel that he gets out of breath more easily that he used to when he exercises.  Phillip Santana's shortness of breath appears to be obesity related and exercise induced. He has agreed to work on weight loss and gradually increase exercise to treat his exercise induced shortness of breath. Will continue to monitor closely.  Health Maintenance:   Obesity   Plan: Will do EKG, indirect calorimetry, and labs.     Vitamin D Deficiency He is at risk for vitamin D deficiency due to obesity.  He is not on any vitamins or vitamin D.   Plan: Will check for vitamin D deficiency.   Phillip Santana had a positive depression screening. Depression is commonly associated with obesity and often results in emotional eating behaviors. We will monitor this closely and work on CBT to help improve the non-hunger eating patterns. Referral to Psychology may be required if no improvement is seen as he continues in our clinic.    Previous labs reviewed today. Date:  He had some labs at work about 8 months ago.   Labs done today p prolactin and magnesium    Generalized Obesity: BMI (Calculated): 32.41   Phillip Santana is currently in the action stage of change and his goal is to begin weight loss efforts. I recommend Phillip Santana begin the structured treatment plan as follows:  He has agreed to Category 4 Plan  Exercise goals: For substantial health benefits, adults should do at least 150 minutes (2 hours and 30 minutes) a week of moderate-intensity, or 75 minutes (1 hour and 15 minutes) a week of vigorous-intensity aerobic physical activity, or an equivalent combination of moderate- and vigorous-intensity aerobic activity. Aerobic activity should be performed in episodes of at least 10 minutes, and preferably, it should be spread throughout the week.Will continue using his elliptical.   Behavioral modification strategies:increasing lean protein intake, decreasing simple carbohydrates, increasing vegetables, decrease liquid calories, no skipping meals, meal planning and cooking strategies, keeping healthy foods in the home,  better snacking choices, avoiding temptations, and planning for success  He was informed of the importance of frequent follow-up visits to maximize his success with intensive lifestyle modifications for his multiple health conditions. He was informed we would discuss his lab results at his next visit unless there is a critical issue that needs to be addressed sooner. Phillip Santana agreed to keep his next visit at the agreed upon time to discuss these results.  Objective:  General: Cooperative, alert, well developed, in no acute distress. HEENT: Conjunctivae and lids unremarkable. Cardiovascular: Regular rhythm.  Lungs: Normal work of breathing. Neurologic: No focal deficits.   Lab Results  Component Value Date   CREATININE 1.00 07/27/2021   BUN 13 07/27/2021   NA 140 07/27/2021   K 4.7 07/27/2021   CL 104 07/27/2021   CO2 22 07/27/2021   Lab Results  Component Value Date   ALT 28 06/13/2019   AST 29 06/13/2019   ALKPHOS 88 06/13/2019   BILITOT 0.3 06/13/2019   Lab Results  Component Value Date   HGBA1C 5.8 (H) 07/27/2021   HGBA1C 5.7 (H) 02/18/2021   No results found for: "INSULIN" Lab Results  Component Value  Date   TSH 1.36 01/26/2021   No results found for: "CHOL", "HDL", "LDLCALC", "LDLDIRECT", "TRIG", "CHOLHDL" Lab Results  Component Value Date   WBC 4.7 07/27/2021   HGB 16.1 07/27/2021   HCT 49.0 07/27/2021   MCV 88 07/27/2021   PLT 216 07/27/2021   No results found for: "IRON", "TIBC", "FERRITIN"  Attestation Statements:  Applicable history such as the following:  allergies, medications, problem list, medical history, surgical history, family history, social history, and previous encounter notes reviewed by clinician on day of visit:   Time spent on visit including the items listed below was 53 minutes.  -preparing to see the patient (e.g., review of tests, history, previous notes) -obtaining and/or reviewing separately obtained history -counseling and  educating the patient/family/caregiver -documenting clinical information in the electronic or other health record -ordering medications, tests, or procedures -independently interpreting results and communicating results to the patient/ family/caregiver -referring and communicating with other health care professionals  -care coordination   This may have been prepared with the assistance of Engineer, civil (consulting).  Occasional wrong-word or sound-a-like substitutions may have occurred due to the inherent limitations of voice recognition software.    Corinna Capra, DO

## 2023-09-21 LAB — COMPREHENSIVE METABOLIC PANEL
ALT: 36 [IU]/L (ref 0–44)
AST: 31 [IU]/L (ref 0–40)
Albumin: 4.5 g/dL (ref 3.8–4.9)
Alkaline Phosphatase: 80 [IU]/L (ref 44–121)
BUN/Creatinine Ratio: 14 (ref 9–20)
BUN: 13 mg/dL (ref 6–24)
Bilirubin Total: 0.5 mg/dL (ref 0.0–1.2)
CO2: 21 mmol/L (ref 20–29)
Calcium: 9.6 mg/dL (ref 8.7–10.2)
Chloride: 104 mmol/L (ref 96–106)
Creatinine, Ser: 0.95 mg/dL (ref 0.76–1.27)
Globulin, Total: 2.7 g/dL (ref 1.5–4.5)
Glucose: 101 mg/dL — ABNORMAL HIGH (ref 70–99)
Potassium: 4.8 mmol/L (ref 3.5–5.2)
Sodium: 141 mmol/L (ref 134–144)
Total Protein: 7.2 g/dL (ref 6.0–8.5)
eGFR: 97 mL/min/{1.73_m2} (ref >59)

## 2023-09-21 LAB — LIPID PANEL WITH LDL/HDL RATIO
Cholesterol, Total: 258 mg/dL — ABNORMAL HIGH (ref 100–199)
HDL: 43 mg/dL (ref >39)
LDL Chol Calc (NIH): 195 mg/dL — ABNORMAL HIGH (ref 0–99)
LDL/HDL Ratio: 4.5 {ratio} — ABNORMAL HIGH (ref 0.0–3.6)
Triglycerides: 112 mg/dL (ref 0–149)
VLDL Cholesterol Cal: 20 mg/dL (ref 5–40)

## 2023-09-21 LAB — HEMOGLOBIN A1C
Est. average glucose Bld gHb Est-mCnc: 123 mg/dL
Hgb A1c MFr Bld: 5.9 % — ABNORMAL HIGH (ref 4.8–5.6)

## 2023-09-21 LAB — INSULIN, RANDOM: INSULIN: 24.6 u[IU]/mL (ref 2.6–24.9)

## 2023-09-21 LAB — TSH+T4F+T3FREE
Free T4: 0.99 ng/dL (ref 0.82–1.77)
T3, Free: 3.3 pg/mL (ref 2.0–4.4)
TSH: 1.1 u[IU]/mL (ref 0.450–4.500)

## 2023-09-21 LAB — VITAMIN B12: Vitamin B-12: 441 pg/mL (ref 232–1245)

## 2023-09-21 LAB — PROLACTIN: Prolactin: 12.3 ng/mL (ref 3.6–25.2)

## 2023-09-21 LAB — VITAMIN D 25 HYDROXY (VIT D DEFICIENCY, FRACTURES): Vit D, 25-Hydroxy: 29.6 ng/mL — ABNORMAL LOW (ref 30.0–100.0)

## 2023-09-27 ENCOUNTER — Encounter: Payer: Self-pay | Admitting: Bariatrics

## 2023-09-27 DIAGNOSIS — R7303 Prediabetes: Secondary | ICD-10-CM | POA: Insufficient documentation

## 2023-09-27 DIAGNOSIS — E559 Vitamin D deficiency, unspecified: Secondary | ICD-10-CM | POA: Insufficient documentation

## 2023-09-27 DIAGNOSIS — E7800 Pure hypercholesterolemia, unspecified: Secondary | ICD-10-CM | POA: Insufficient documentation

## 2023-10-02 ENCOUNTER — Telehealth: Payer: Self-pay

## 2023-10-02 ENCOUNTER — Encounter: Payer: Self-pay | Admitting: Bariatrics

## 2023-10-02 ENCOUNTER — Ambulatory Visit: Payer: BC Managed Care – PPO | Admitting: Bariatrics

## 2023-10-02 VITALS — BP 134/84 | HR 71 | Temp 97.8°F | Ht 67.0 in | Wt 209.0 lb

## 2023-10-02 DIAGNOSIS — Z6832 Body mass index (BMI) 32.0-32.9, adult: Secondary | ICD-10-CM

## 2023-10-02 DIAGNOSIS — E669 Obesity, unspecified: Secondary | ICD-10-CM

## 2023-10-02 DIAGNOSIS — E6609 Other obesity due to excess calories: Secondary | ICD-10-CM

## 2023-10-02 DIAGNOSIS — Z8639 Personal history of other endocrine, nutritional and metabolic disease: Secondary | ICD-10-CM

## 2023-10-02 DIAGNOSIS — E559 Vitamin D deficiency, unspecified: Secondary | ICD-10-CM | POA: Diagnosis not present

## 2023-10-02 DIAGNOSIS — E7800 Pure hypercholesterolemia, unspecified: Secondary | ICD-10-CM | POA: Diagnosis not present

## 2023-10-02 DIAGNOSIS — R7303 Prediabetes: Secondary | ICD-10-CM

## 2023-10-02 MED ORDER — ZEPBOUND 2.5 MG/0.5ML ~~LOC~~ SOAJ: 2.5000 mg | SUBCUTANEOUS | 0 refills | Status: DC

## 2023-10-02 MED ORDER — VITAMIN D (ERGOCALCIFEROL) 1.25 MG (50000 UNIT) PO CAPS: 50000.0000 [IU] | ORAL_CAPSULE | ORAL | 0 refills | Status: DC

## 2023-10-02 NOTE — Telephone Encounter (Signed)
Started PA for [employer]ep[employer] via covermymeds.

## 2023-10-02 NOTE — Progress Notes (Signed)
 First follow-up after initial visit.        WEIGHT SUMMARY AND BIOMETRICS  Weight Lost Since Last Visit: 0  Weight Gained Since Last Visit: 2lb   Vitals Temp: 97.8 F (36.6 C) BP: 134/84 Pulse Rate: 71 SpO2: 97 %   Anthropometric Measurements Height: 5' 7 (1.702 m) Weight: 209 lb (94.8 kg) BMI (Calculated): 32.73 Weight at Last Visit: 207lb Weight Lost Since Last Visit: 0 Weight Gained Since Last Visit: 2lb Starting Weight: 207lb Total Weight Loss (lbs): 0 lb (0 kg)   Body Composition  Body Fat %: 31 % Fat Mass (lbs): 65 lbs Muscle Mass (lbs): 137.4 lbs Total Body Water (lbs): 98.4 lbs Visceral Fat Rating : 16   Other Clinical Data Fasting: no Labs: no Today's Visit #: 2 Starting Date: 09/20/23    OBESITY Phillip Santana is here to discuss his progress with his obesity treatment plan along with follow-up of his obesity related diagnoses.    Nutrition Plan: the Category 4 plan - 2% adherence.  Current exercise:  Goes to the gym for exercise.  Interim History:  He has gained 2 lbs since his initial visit.  Not eating all of the food on the plan., Protein intake is as prescribed, Is not skipping meals, Water intake is adequate., Reports polyphagia, and Reports excessive cravings.  Initial positives regarding the dietary plan:  Initial challenges regarding  the dietary plan:   Pharmacotherapy: Osualdo is on none  Hunger is moderately controlled.  Cravings are moderately controlled.  Assessment/Plan:   Prediabetes Last A1c was 5.9  Medication(s): none. He would like to start Zepbound . He was on Wegovy  in the past with minimal GI issues.  Lab Results  Component Value Date   HGBA1C 5.9 (H) 09/20/2023   HGBA1C 5.8 (H) 07/27/2021   HGBA1C 5.7 (H) 02/18/2021   Lab Results  Component Value Date   INSULIN  24.6 09/20/2023    Plan: Will  minimize all refined carbohydrates both sweets and starches.  Will work on the plan and exercise.  Consider both aerobic and resistance training.  Will keep protein, water, and fiber intake high.  Aim for 7 to 9 hours of sleep nightly.  Start Zepbound  2.5 mg SQ weekly   Elevated Cholesterol:  LDL is not at goal. Medication(s): none Cardiovascular risk factors: male gender, obesity (BMI >= 30 kg/m2), and sedentary lifestyle  Lab Results  Component Value Date   CHOL 258 (H) 09/20/2023   HDL 43 09/20/2023   LDLCALC 195 (H) 09/20/2023   TRIG 112 09/20/2023   Lab Results  Component Value Date   ALT 36 09/20/2023   AST 31 09/20/2023   ALKPHOS 80 09/20/2023   BILITOT 0.5 09/20/2023   The 10-year ASCVD risk score (Arnett DK, et al., 2019) is: 6.8%   Values used to calculate the score:     Age: 52 years     Sex: Male     Is Non-Hispanic African American: No     Diabetic: No     Tobacco smoker: No     Systolic Blood Pressure: 134 mmHg     Is BP treated: No     HDL Cholesterol: 43 mg/dL     Total Cholesterol: 258 mg/dL  Plan:  Continue statin.  Information sheet on healthy vs unhealthy fats.  Will avoid all trans fats.  Will read labels Will minimize saturated fats except the following: low fat meats in moderation, diary, and limited dark chocolate.  Increase Omega 3 in foods, and consider  an Omega 3 supplement.     Vitamin D  Deficiency Vitamin D  is at goal of 50.  Most recent vitamin D  level was 29.6. He is on  prescription ergocalciferol  50,000 IU weekly. Lab Results  Component Value Date   VD25OH 29.6 (L) 09/20/2023    Plan: Begin prescription vitamin D  50,000 IU weekly.    History of Hyperprolactinemia:   He has a history of hyperprolactinemia per his chart. He was seen by an endocrinologist in 2022. He has had some cramps in his extremities at times.   Plan:  Checked prolactin level and was normal. Will recheck if s/s r/t to this condition reoccur.  Will  begin magnesium glycinate OTC, will not check magnesium as serum levels for magnesium are not that accurate, as most magnesium is intracellular.   Labs discussed today (CMP, Lipids, HgbA1c, insulin , glucose, vitamin D , and thyroid  panel).    Generalized Obesity: Current BMI BMI (Calculated): 32.73   Pharmacotherapy Plan Start  Zepbound  2.5 mg SQ weekly  Atul is currently in the action stage of change. As such, his goal is to continue with weight loss efforts.  He has agreed to the Category 4 plan.  Exercise goals: All adults should avoid inactivity. Some physical activity is better than none, and adults who participate in any amount of physical activity gain some health benefits.  Behavioral modification strategies: increasing lean protein intake, no meal skipping, meal planning , increase water intake, better snacking choices, planning for success, increasing vegetables, get rid of junk food in the home, avoiding temptations, keep healthy foods in the home, and mindful eating.  Dmario has agreed to follow-up with our clinic in 2 weeks.    Labs reviewed today from last visit (CMP, Lipids, HgbA1c, insulin , vitamin D , B 12, and thyroid  panel).   Objective:   VITALS: Per patient if applicable, see vitals. GENERAL: Alert and in no acute distress. CARDIOPULMONARY: No increased WOB. Speaking in clear sentences.  PSYCH: Pleasant and cooperative. Speech normal rate and rhythm. Affect is appropriate. Insight and judgement are appropriate. Attention is focused, linear, and appropriate.  NEURO: Oriented as arrived to appointment on time with no prompting.   Attestation Statements:   This was prepared with the assistance of Engineer, Civil (consulting).  Occasional wrong-word or sound-a-like substitutions may have occurred due to the inherent limitations of voice recognition software.   Clayborne Daring, DO

## 2023-10-09 ENCOUNTER — Telehealth (INDEPENDENT_AMBULATORY_CARE_PROVIDER_SITE_OTHER): Payer: Self-pay | Admitting: Bariatrics

## 2023-10-09 NOTE — Telephone Encounter (Signed)
 Zepbound denied. Patient needs to be in a Weight management program for 6 months or more.

## 2023-10-09 NOTE — Telephone Encounter (Signed)
Responded via my chart to patient

## 2023-10-09 NOTE — Telephone Encounter (Signed)
 Pt's spouse called in stating the pharmacy informed her that the pt needs a prior authorization for the medication Zepbound. Please follow up.

## 2023-10-15 ENCOUNTER — Telehealth: Payer: Self-pay | Admitting: Family Medicine

## 2023-10-15 NOTE — Telephone Encounter (Signed)
 Pt is calling because pharmacy has not received his Rx for Zepbound. Pt pharmacy is [facility]. Pt also stated RX was approved through insurance.  Please call pt back about RX.

## 2023-10-15 NOTE — Telephone Encounter (Signed)
 Phone call to patient to let him know that his medication Zepbound was denied and they can discuss other options at his next appointment.

## 2023-10-23 ENCOUNTER — Ambulatory Visit (INDEPENDENT_AMBULATORY_CARE_PROVIDER_SITE_OTHER): Payer: BC Managed Care – PPO | Admitting: Nurse Practitioner

## 2023-10-23 ENCOUNTER — Encounter: Payer: Self-pay | Admitting: Nurse Practitioner

## 2023-10-23 VITALS — BP 134/86 | HR 70 | Temp 97.9°F | Ht 67.0 in | Wt 213.0 lb

## 2023-10-23 DIAGNOSIS — E66811 Obesity, class 1: Secondary | ICD-10-CM | POA: Diagnosis not present

## 2023-10-23 DIAGNOSIS — R7303 Prediabetes: Secondary | ICD-10-CM

## 2023-10-23 DIAGNOSIS — Z6833 Body mass index (BMI) 33.0-33.9, adult: Secondary | ICD-10-CM

## 2023-10-23 DIAGNOSIS — E559 Vitamin D deficiency, unspecified: Secondary | ICD-10-CM

## 2023-10-23 DIAGNOSIS — E6609 Other obesity due to excess calories: Secondary | ICD-10-CM

## 2023-10-23 MED ORDER — METFORMIN HCL 500 MG PO TABS: 500.0000 mg | ORAL_TABLET | Freq: Every day | ORAL | 0 refills | Status: DC

## 2023-10-23 NOTE — Progress Notes (Signed)
Office: [phone]  /  Fax: [phone]  WEIGHT SUMMARY AND BIOMETRICS  Weight Lost Since Last Visit: 0  Weight Gained Since Last Visit: 4lb   Vitals Temp: 97.9 F (36.6 C) BP: 134/86 Pulse Rate: 70 SpO2: 96 %   Anthropometric Measurements Height: 5\' 7"  (1.702 m) Weight: 213 lb (96.6 kg) BMI (Calculated): 33.35 Weight at Last Visit: 209lb Weight Lost Since Last Visit: 0 Weight Gained Since Last Visit: 4lb Starting Weight: 207lb Total Weight Loss (lbs): 0 lb (0 kg)   Body Composition  Body Fat %: 31.8 % Fat Mass (lbs): 68 lbs Muscle Mass (lbs): 138.6 lbs Total Body Water (lbs): 100.4 lbs Visceral Fat Rating : 16   Other Clinical Data Fasting: no Labs: no Today's Visit #: 3 Starting Date: 09/20/23     HPI  Chief Complaint: OBESITY  Square is here to discuss his progress with his obesity treatment plan. He is on the the Category 4 Plan and states he is following his eating plan approximately 2 % of the time. He states he is exercising 120 minutes 4 days per week-cardio and resistance training.   Interval History:  Since last office visit he has gained 4 pounds.  He feels things are going terrible especially since not being approved for Zepbound.  He is eating 3 meals with larger portion sizes.  He struggles with polyphagia especially in the am.  Most of the time he eats because he loves food.     Pharmacotherapy for weight loss: He is not currently taking medications  for medical weight loss.    Previous pharmacotherapy for medical weight loss:  Wegovy-had some GI upset  Bariatric surgery:  Patient has not had bariatric surgery.    Prediabetes Last A1c was 5.9  Medication(s): None Polyphagia:Yes FH:  none  Lab Results  Component Value Date   HGBA1C 5.9 (H) 09/20/2023   HGBA1C 5.8 (H) 07/27/2021   HGBA1C 5.7 (H) 02/18/2021   Lab Results  Component Value Date   INSULIN 24.6 09/20/2023   Vit D deficiency  He is not currently taking Vit D  50,000 IU weekly.  Plans to start.    Lab Results  Component Value Date   VD25OH 29.6 (L) 09/20/2023     PHYSICAL EXAM:  Blood pressure 134/86, pulse 70, temperature 97.9 F (36.6 C), height 5\' 7"  (1.702 m), weight 213 lb (96.6 kg), SpO2 96%. Body mass index is 33.36 kg/m.  General: He is overweight, cooperative, alert, well developed, and in no acute distress. PSYCH: Has normal mood, affect and thought process.   Extremities: No edema.  Neurologic: No gross sensory or motor deficits. No tremors or fasciculations noted.    DIAGNOSTIC DATA REVIEWED:  BMET    Component Value Date/Time   NA 141 09/20/2023 0952   K 4.8 09/20/2023 0952   CL 104 09/20/2023 0952   CO2 21 09/20/2023 0952   GLUCOSE 101 (H) 09/20/2023 0952   BUN 13 09/20/2023 0952   CREATININE 0.95 09/20/2023 0952   CALCIUM 9.6 09/20/2023 0952   GFRNONAA 71 06/13/2019 1001   GFRAA 82 06/13/2019 1001   Lab Results  Component Value Date   HGBA1C 5.9 (H) 09/20/2023   HGBA1C 5.7 (H) 02/18/2021   Lab Results  Component Value Date   INSULIN 24.6 09/20/2023   Lab Results  Component Value Date   TSH 1.100 09/20/2023   CBC    Component Value Date/Time   WBC 4.7 07/27/2021 1005   RBC 5.59 07/27/2021 1005  HGB 16.1 07/27/2021 1005   HCT 49.0 07/27/2021 1005   PLT 216 07/27/2021 1005   MCV 88 07/27/2021 1005   MCH 28.8 07/27/2021 1005   MCHC 32.9 07/27/2021 1005   RDW 13.1 07/27/2021 1005   Iron Studies No results found for: "IRON", "TIBC", "FERRITIN", "IRONPCTSAT" Lipid Panel     Component Value Date/Time   CHOL 258 (H) 09/20/2023 0952   TRIG 112 09/20/2023 0952   HDL 43 09/20/2023 0952   LDLCALC 195 (H) 09/20/2023 0952   Hepatic Function Panel     Component Value Date/Time   PROT 7.2 09/20/2023 0952   ALBUMIN 4.5 09/20/2023 0952   AST 31 09/20/2023 0952   ALT 36 09/20/2023 0952   ALKPHOS 80 09/20/2023 0952   BILITOT 0.5 09/20/2023 0952      Component Value Date/Time   TSH 1.100  09/20/2023 0952   Nutritional Lab Results  Component Value Date   VD25OH 29.6 (L) 09/20/2023     ASSESSMENT AND PLAN  TREATMENT PLAN FOR OBESITY:  Recommended Dietary Goals  Nikia is currently in the action stage of change. As such, his goal is to continue weight management plan. He has agreed to keeping a food journal and adhering to recommended goals of 1800 calories and 100+ protein.  Will review at next visit.    Behavioral Intervention  We discussed the following Behavioral Modification Strategies today: increasing lean protein intake to established goals, decreasing simple carbohydrates , increasing vegetables, avoiding skipping meals, increasing water intake , work on meal planning and preparation, work on tracking and journaling calories using tracking application, and continue to work on maintaining a reduced calorie state, getting the recommended amount of protein, incorporating whole foods, making healthy choices, staying well hydrated and practicing mindfulness when eating..  Additional resources provided today: IR and Metformin  Recommended Physical Activity Goals  Vishnu has been advised to work up to 150 minutes of moderate intensity aerobic activity a week and strengthening exercises 2-3 times per week for cardiovascular health, weight loss maintenance and preservation of muscle mass.   He has agreed to Continue current level of physical activity    Pharmacotherapy We discussed various medication options to help Indio with his weight loss efforts and we both agreed to start Zepbound at a later date.  ASSOCIATED CONDITIONS ADDRESSED TODAY  Action/Plan  Prediabetes -     Start metFORMIN HCl; Take 1 tablet (500 mg total) by mouth daily with breakfast.  Dispense: 30 tablet; Refill: 0.  Side effects discussed.    Jamario will continue to work on weight loss, exercise, and decreasing simple carbohydrates to help decrease the risk of diabetes.    Vitamin D  deficiency Start Vit D as directed  Low Vitamin D level contributes to fatigue and are associated with obesity, breast, and colon cancer. He agrees to continue to take prescription Vitamin D @50 ,000 IU every week and will follow-up for routine testing of Vitamin D, at least 2-3 times per year to avoid over-replacement.   Class 1 obesity due to excess calories without serious comorbidity with body mass index (BMI) of 33.0 to 33.9 in adult         Return in about 3 weeks (around 11/13/2023).Marland Kitchen He was informed of the importance of frequent follow up visits to maximize his success with intensive lifestyle modifications for his multiple health conditions.   ATTESTASTION STATEMENTS:  Reviewed by clinician on day of visit: allergies, medications, problem list, medical history, surgical history, family history, social  history, and previous encounter notes.     Theodis Sato. Nydia Ytuarte FNP-C

## 2023-11-21 ENCOUNTER — Ambulatory Visit: Payer: BC Managed Care – PPO | Admitting: Nurse Practitioner

## 2023-11-22 ENCOUNTER — Ambulatory Visit: Payer: BC Managed Care – PPO | Admitting: Nurse Practitioner

## 2023-12-05 ENCOUNTER — Encounter: Payer: Self-pay | Admitting: Nurse Practitioner

## 2023-12-05 ENCOUNTER — Ambulatory Visit: Payer: BC Managed Care – PPO | Admitting: Nurse Practitioner

## 2023-12-05 VITALS — BP 137/84 | HR 88 | Temp 98.4°F | Ht 67.0 in | Wt 216.0 lb

## 2023-12-05 DIAGNOSIS — M9903 Segmental and somatic dysfunction of lumbar region: Secondary | ICD-10-CM | POA: Diagnosis not present

## 2023-12-05 DIAGNOSIS — M461 Sacroiliitis, not elsewhere classified: Secondary | ICD-10-CM | POA: Diagnosis not present

## 2023-12-05 DIAGNOSIS — R7303 Prediabetes: Secondary | ICD-10-CM | POA: Diagnosis not present

## 2023-12-05 DIAGNOSIS — E559 Vitamin D deficiency, unspecified: Secondary | ICD-10-CM

## 2023-12-05 DIAGNOSIS — M5442 Lumbago with sciatica, left side: Secondary | ICD-10-CM | POA: Diagnosis not present

## 2023-12-05 DIAGNOSIS — Z6833 Body mass index (BMI) 33.0-33.9, adult: Secondary | ICD-10-CM

## 2023-12-05 DIAGNOSIS — E66811 Other obesity due to excess calories: Secondary | ICD-10-CM

## 2023-12-05 DIAGNOSIS — M9904 Segmental and somatic dysfunction of sacral region: Secondary | ICD-10-CM | POA: Diagnosis not present

## 2023-12-05 MED ORDER — WEGOVY 0.25 MG/0.5ML ~~LOC~~ SOAJ: 0.2500 mg | SUBCUTANEOUS | 0 refills | Status: AC

## 2023-12-05 NOTE — Progress Notes (Signed)
 Office: [phone]  /  Fax: [phone]  WEIGHT SUMMARY AND BIOMETRICS  Weight Lost Since Last Visit: 0lb  Weight Gained Since Last Visit: 3lb   Vitals Temp: 98.4 F (36.9 C) BP: 137/84 Pulse Rate: 88 SpO2: 99 %   Anthropometric Measurements Height: 5\' 7"  (1.702 m) Weight: 216 lb (98 kg) BMI (Calculated): 33.82 Weight at Last Visit: 213lb Weight Lost Since Last Visit: 0lb Weight Gained Since Last Visit: 3lb Starting Weight: 207lb Total Weight Loss (lbs): 0 lb (0 kg)   Body Composition  Body Fat %: 32.1 % Fat Mass (lbs): 69.4 lbs Muscle Mass (lbs): 139.8 lbs Total Body Water (lbs): 101.8 lbs Visceral Fat Rating : 17   Other Clinical Data Fasting: No Labs: No Today's Visit #: 4 Starting Date: 09/20/23     HPI  Chief Complaint: OBESITY  Phillip Santana is here to discuss his progress with his obesity treatment plan. He is on the the Category 4 Plan and states he is following his eating plan approximately 1 % of the time. He states he is exercising 60+ minutes 3-4 days per week.   Interval History:  Since last office visit he has gained 3 pounds.  He is trying to eat more protein.  He is trying to read more labels and make healthier choices.  He is drinking water daily and wine on the weekends.  Stopped sodas one week ago.  Notes polyphagia and cravings.   Pharmacotherapy for weight loss: He is not currently taking medications  for medical weight loss.     Previous pharmacotherapy for medical weight loss:  Wegovy-had some GI upset   Bariatric surgery:  Patient has not had bariatric surgery.  Prediabetes Last A1c was 5.9  Medication(s): Took Metformin 500mg  x 2 weeks and then increased to 1000mg  x 1 week.  Denied side effects while taking Metformin.  Didn't feel that it helped with cravings.  Polyphagia:Yes Lab Results  Component Value Date   HGBA1C 5.9 (H) 09/20/2023   HGBA1C 5.8 (H) 07/27/2021   HGBA1C 5.7 (H) 02/18/2021   Lab Results  Component Value  Date   INSULIN 24.6 09/20/2023   Vit D deficiency  He is not taking Vit D 50,000 IU weekly.  Prefers to take OTC.    Lab Results  Component Value Date   VD25OH 29.6 (L) 09/20/2023      PHYSICAL EXAM:  Blood pressure 137/84, pulse 88, temperature 98.4 F (36.9 C), height 5\' 7"  (1.702 m), weight 216 lb (98 kg), SpO2 99%. Body mass index is 33.83 kg/m.  General: He is overweight, cooperative, alert, well developed, and in no acute distress. PSYCH: Has normal mood, affect and thought process.   Extremities: No edema.  Neurologic: No gross sensory or motor deficits. No tremors or fasciculations noted.    DIAGNOSTIC DATA REVIEWED:  BMET    Component Value Date/Time   NA 141 09/20/2023 0952   K 4.8 09/20/2023 0952   CL 104 09/20/2023 0952   CO2 21 09/20/2023 0952   GLUCOSE 101 (H) 09/20/2023 0952   BUN 13 09/20/2023 0952   CREATININE 0.95 09/20/2023 0952   CALCIUM 9.6 09/20/2023 0952   GFRNONAA 71 06/13/2019 1001   GFRAA 82 06/13/2019 1001   Lab Results  Component Value Date   HGBA1C 5.9 (H) 09/20/2023   HGBA1C 5.7 (H) 02/18/2021   Lab Results  Component Value Date   INSULIN 24.6 09/20/2023   Lab Results  Component Value Date   TSH 1.100 09/20/2023  CBC    Component Value Date/Time   WBC 4.7 07/27/2021 1005   RBC 5.59 07/27/2021 1005   HGB 16.1 07/27/2021 1005   HCT 49.0 07/27/2021 1005   PLT 216 07/27/2021 1005   MCV 88 07/27/2021 1005   MCH 28.8 07/27/2021 1005   MCHC 32.9 07/27/2021 1005   RDW 13.1 07/27/2021 1005   Iron Studies No results found for: "IRON", "TIBC", "FERRITIN", "IRONPCTSAT" Lipid Panel     Component Value Date/Time   CHOL 258 (H) 09/20/2023 0952   TRIG 112 09/20/2023 0952   HDL 43 09/20/2023 0952   LDLCALC 195 (H) 09/20/2023 0952   Hepatic Function Panel     Component Value Date/Time   PROT 7.2 09/20/2023 0952   ALBUMIN 4.5 09/20/2023 0952   AST 31 09/20/2023 0952   ALT 36 09/20/2023 0952   ALKPHOS 80 09/20/2023 0952    BILITOT 0.5 09/20/2023 0952      Component Value Date/Time   TSH 1.100 09/20/2023 0952   Nutritional Lab Results  Component Value Date   VD25OH 29.6 (L) 09/20/2023     ASSESSMENT AND PLAN  TREATMENT PLAN FOR OBESITY:  Recommended Dietary Goals  Dakhari is currently in the action stage of change. As such, his goal is to continue weight management plan. He has agreed to the Category 4 Plan.  Behavioral Intervention  We discussed the following Behavioral Modification Strategies today: increasing lean protein intake to established goals, decreasing simple carbohydrates , increasing vegetables, increasing water intake , work on meal planning and preparation, reading food labels , keeping healthy foods at home, continue to practice mindfulness when eating, planning for success, and continue to work on maintaining a reduced calorie state, getting the recommended amount of protein, incorporating whole foods, making healthy choices, staying well hydrated and practicing mindfulness when eating..  Additional resources provided today: NA  Recommended Physical Activity Goals  Rance has been advised to work up to 150 minutes of moderate intensity aerobic activity a week and strengthening exercises 2-3 times per week for cardiovascular health, weight loss maintenance and preservation of muscle mass.   He has agreed to Increase the intensity, frequency or duration of strengthening exercises  and Increase the intensity, frequency or duration of aerobic exercises     Pharmacotherapy We discussed various medication options to help Taym with his weight loss efforts and we both agreed to start [facility] 0.25mg .  Side effects discussed.  Contraindications:  Pancreatitis (active gallstones) Medullary thyroid cancer High triglycerides (>500)-will need labs prior to starting Multiple Endocrine Neoplasia syndrome type 2 (MEN 2) Trying to get pregnant Breastfeeding Use with caution with taking insulin  or sulfonylureas (will need to monitor blood sugars for hypoglycemia)  ASSOCIATED CONDITIONS ADDRESSED TODAY  Action/Plan  Prediabetes Doesn't want to restart Metformin at this time  Evo will continue to work on weight loss, exercise, and decreasing simple carbohydrates to help decrease the risk of diabetes.    Vitamin D deficiency Patient does not want to start vitamin D weekly prescription and will take vitamin D over-the-counter.  Will recheck labs in 3 months.  Class 1 obesity due to excess calories without serious comorbidity with body mass index (BMI) of 33.0 to 33.9 in adult [facility]; Inject 0.25 mg into the skin once a week.  Dispense: 2 mL; Refill: 0.  Side effects discussed.  I discussed with him extensively today to take medications as prescribed and as directed due to possible complications or side effects.  Return in about 4 weeks (around 01/02/2024).Marland Kitchen He was informed of the importance of frequent follow up visits to maximize his success with intensive lifestyle modifications for his multiple health conditions.   ATTESTASTION STATEMENTS:  Reviewed by clinician on day of visit: allergies, medications, problem list, medical history, surgical history, family history, social history, and previous encounter notes.     Theodis Sato. Joshua Zeringue FNP-C

## 2023-12-05 NOTE — Patient Instructions (Signed)
 What is a GLP-1 Glucagon like peptide-1 (GLP-1) agonists represent a class of medications used to treat type 2 diabetes mellitus and obesity.  GLP-1 medications mimic the action of a hormone called glucagon like peptide 1.  When blood sugar levels start to rise/increase these drugs stimulate the body to produce more insulin.  When that happens, the extra insulin helps to lower the blood sugar levels in the body.  This in returns helps with decreasing cravings.  These medications also slow the movement of food from the stomach into the small intestine.  This in return helps one to full faster and longer.   Diabetic medications: Approved for treatment of diabetes mellitus but does not have full approval for weight loss use Victoza (liraglutide) Ozempic (semaglutide) Mounjaro Trulicity Rybelsus  Weight loss medications: Approved for long-term weight loss use.        Saxenda (liraglutide) Wegovy (semaglutide) Zepbound Contraindications:  Pancreatitis (active gallstones) Medullary thyroid cancer High triglycerides (>500)-will need labs prior to starting Multiple Endocrine Neoplasia syndrome type 2 (MEN 2) Trying to get pregnant Breastfeeding Use with caution with taking insulin or sulfonylureas (will need to monitor blood sugars for hypoglycemia) Side effects (most common): Most common side effects are nausea, gas, bloating and constipation.  Other possible side effects are headaches, belching, diarrhea, tiredness (fatigue), vomiting, upset stomach, dizziness, heartburn and stomach (abdominal pain).  If you think that you are becoming dehydrated, please inform our office or your primary family provider.  Stop immediately and go to ER if you have any symptoms of a serious allergic reaction including swelling of your face, lips, tongue or throat; problems breathing or swallowing; severe rash or itching; fainting or feeling dizzy; or very rapid heart rate.                                                                                          Steps to starting your [facility]  The office staff will send a prior authorization request to your insurance company for approval. We will send you a mychart message once we hear back from your insurance with a decision.  This can take up to 7-10 business days.   Once your WegovyTis approved, you may then pick up [facility] pen from your pharmacy.    Learn how to do Wegovy injections on the [employer] website. There is a training video that will walk you through how to safely perform the injection. If you have questions for our clinical staff, please contact our  clinical staff. If you have any symptoms of allergic reaction to [facility] discontinue immediately and call 911.  1. What should I tell my provider before using WegovyT ? have or have had problems with your pancreas or kidneys. have type 2 diabetes and a history of diabetic retinopathy. have or have had depression, suicidal thoughts, or mental health issues. are pregnant or plan to become pregnant. Joesphine Bare may harm your unborn baby. You should stop using WegovyT 3 months before you plan to become pregnant or if you are breastfeeding or plan to breastfeed. It is not known if WegovyT passes into your breast milk.  2. What is [place] and  how does it work?  Joesphine Bare is an injectable prescription medication prescribed by your provider to help with your weight loss.  This medicine will be most effective when combined with a reduced calorie diet and physical activity.  Joesphine Bare is not for the treatment of type 2 diabetes mellitus. Joesphine Bare should not be used with other GLP-1 receptor agonist medicines. The addition of WegovyT in  patients treated with insulin has not been evaluated. When initiating WegovyT, consider reducing the dose of concomitantly administered insulin secretagogues (such as sulfonylureas) or insulin to reduce the risk of  hypoglycemia.  One role of GLP-1 is to send a signal to your  brain to tell it you are full. It also slows down stomach emptying which will make you feel full longer and may help with reducing cravings.   3.  How should I take WegovyT?  Administer WegovyT once weekly, on the same day each week, at any time of day, with or without meals Inject subcutaneously in the abdomen, thigh or upper arm Initiate at 0.25 mg once weekly for 4 weeks. In 4 week intervals, increase the dose until a dose of 2.4 mg is reached (we will discuss with you the dosage at each visit). The maintenance dose of WegovyT is 2.4 mg once weekly.  The dosing schedule of Wegovy is:  0.25 mg per week X 4 weeks 0.5 mg per week X 4 weeks 1.0 mg per week X 4 weeks 1.7 mg per week X 4 weeks 2.4 mg per week   Missed dose   If you miss your injection day, go ahead inject your current dose. You can go >7 days, but not <7 days between injections. You may change your injection day (It must be >7 days). If you miss >2 doses, you can still keep next injection dose the same or follow de-escalation schedule which may minimize GI symptoms.   In patients with type 2 diabetes, monitor blood glucose prior to starting and during WEGOVYT treatment.   Inject your dose of Wegovy under the skin (subcutaneous injection) in your stomach area (abdomen), upper leg (thigh) or upper arm. Do not inject into a vein or a muscle. The injection site should be rotated and not given in the same spot each day. Hold the needle under the skin and count to "10". This will allow all of the medicine to be dispensed under the skin. Always wipe your skin with an alcohol prep pad before injection  Dispose of used pen in an approved sharps container. More practical options that can be put in the trash  to go to the landfill are milk jugs or plastic laundry detergent containers with a screw on lid.  What side effects may I notice from taking WegovyT?  Side effects that usually do not require medical attention (report to our  office if they continue or are bothersome): Nausea (most common but decreases over time in most people as their body gets used to the medicine) Diarrhea Constipation (you may take an over the counter laxative if needed) Headache Decreased appetite Upset stomach Tiredness Dizziness Feeling bloated Hair loss Belching Gas Heartburn  Side effects that you should call 911 as soon as possible Vomiting Stomach pain Fever Yellowing of your skin or eyes  Clay-colored stools Increased heart rate while at rest Low blood sugar  Sudden changes in mood, behaviors, thoughts, feelings, or thoughts of suicide If you get a lump or swelling in your neck, hoarseness, trouble swallowing, or shortness of breath. Allergic  reaction such as skin rash, itching, hives, swelling of the face, tongue, or lips  Helpful tips for managing nausea Nausea is a common side effect when first starting WegovyT. If you experience nausea, be sure to connect with your health care provider. He or she will offer guidance on ways to manage it, which may include: Eat bland, low-fat foods, like crackers, toast and rice  Eat foods that contain water, like soups and gelatin  Avoid lying down after you eat  Go outdoors for fresh air  Eat more slowly    Other important information Do not drop your pen or knock it against hard surfaces  Do not expose your pen to any liquids  If you think that your pen may be damaged, do not try to fix it. Use a new one Keep the pen cap on until you are ready to inject. Your pen will no longer be sterile if you store an unused pen without the cap, if you pull the pen cap off and put it on again, or if the pen cap is missing. This could lead to an infection  Store the [place] pen in the refrigerator from 47F to 47F (2C to 8C) If needed, before removing the pen cap, WegovyT can be stored from 8C to 30C (47F to 21F) in the original carton for up to 28 days.  Keep WegovyT in the original  carton to protect it from light  Do not freeze  Throw away pen if WegovyT has been frozen, has been exposed to light or temperatures above 21F (30C), or has been out of the refrigerator for 28 days or longer It's important to properly dispose of your used WegovyT pens. Do not throw the pen away in your household trash. Instead, use an FDA-cleared sharps disposable container or a sturdy household container with a tight-fitting lid, like a heavy duty plastic container.   ZOXWRU pen training website: [employer]astyTh[employer]  Wegovy savings and support link: achegone.com

## 2023-12-06 ENCOUNTER — Telehealth: Payer: Self-pay

## 2023-12-06 NOTE — Telephone Encounter (Signed)
 PA submitted through Cover My Meds for [facility]. Awaiting insurance determination. Key: [id number]

## 2023-12-06 NOTE — Telephone Encounter (Signed)
 Received fax that Phillip Santana has been approved through 06/06/24.

## 2023-12-31 ENCOUNTER — Other Ambulatory Visit: Payer: Self-pay | Admitting: Nurse Practitioner

## 2023-12-31 DIAGNOSIS — E6609 Other obesity due to excess calories: Secondary | ICD-10-CM

## 2024-03-25 ENCOUNTER — Other Ambulatory Visit: Payer: Self-pay

## 2024-03-25 ENCOUNTER — Emergency Department
Admission: EM | Admit: 2024-03-25 | Discharge: 2024-03-25 | Disposition: A | Discharge status: Home / Self Care | Attending: Emergency Medicine | Admitting: Emergency Medicine

## 2024-03-25 DIAGNOSIS — R531 Weakness: Secondary | ICD-10-CM | POA: Insufficient documentation

## 2024-03-25 DIAGNOSIS — R42 Dizziness and giddiness: Secondary | ICD-10-CM | POA: Insufficient documentation

## 2024-03-25 DIAGNOSIS — R197 Diarrhea, unspecified: Secondary | ICD-10-CM | POA: Diagnosis not present

## 2024-03-25 LAB — COMPREHENSIVE METABOLIC PANEL WITH GFR
ALT: 49 U/L — ABNORMAL HIGH (ref 0–44)
AST: 35 U/L (ref 15–41)
Albumin: 4 g/dL (ref 3.5–5.0)
Alkaline Phosphatase: 68 U/L (ref 38–126)
Anion gap: 11 (ref 5–15)
BUN: 15 mg/dL (ref 6–20)
CO2: 17 mmol/L — ABNORMAL LOW (ref 22–32)
Calcium: 8.6 mg/dL — ABNORMAL LOW (ref 8.9–10.3)
Chloride: 105 mmol/L (ref 98–111)
Creatinine, Ser: 0.82 mg/dL (ref 0.61–1.24)
GFR, Estimated: >60 mL/min (ref >60)
Glucose, Bld: 124 mg/dL — ABNORMAL HIGH (ref 70–99)
Potassium: 4 mmol/L (ref 3.5–5.1)
Sodium: 133 mmol/L — ABNORMAL LOW (ref 135–145)
Total Bilirubin: 1.1 mg/dL (ref 0.0–1.2)
Total Protein: 7.6 g/dL (ref 6.5–8.1)

## 2024-03-25 LAB — URINALYSIS, ROUTINE W REFLEX MICROSCOPIC
Bilirubin Urine: NEGATIVE
Glucose, UA: NEGATIVE mg/dL
Hgb urine dipstick: NEGATIVE
Ketones, ur: NEGATIVE mg/dL
Leukocytes,Ua: NEGATIVE
Nitrite: NEGATIVE
Protein, ur: NEGATIVE mg/dL
Specific Gravity, Urine: 1.008 (ref 1.005–1.030)
pH: 6 (ref 5.0–8.0)

## 2024-03-25 LAB — CBC
HCT: 53.6 % — ABNORMAL HIGH (ref 39.0–52.0)
Hemoglobin: 17.8 g/dL — ABNORMAL HIGH (ref 13.0–17.0)
MCH: 27.8 pg (ref 26.0–34.0)
MCHC: 33.2 g/dL (ref 30.0–36.0)
MCV: 83.6 fL (ref 80.0–100.0)
Platelets: 206 10*3/uL (ref 150–400)
RBC: 6.41 MIL/uL — ABNORMAL HIGH (ref 4.22–5.81)
RDW: 12.7 % (ref 11.5–15.5)
WBC: 5.8 10*3/uL (ref 4.0–10.5)
nRBC: 0 % (ref 0.0–0.2)

## 2024-03-25 LAB — LIPASE, BLOOD: Lipase: 39 U/L (ref 11–51)

## 2024-03-25 MED ORDER — ONDANSETRON HCL 4 MG/2ML IJ SOLN
4.0000 mg | Freq: Once | INTRAMUSCULAR | Status: AC
  Administered 2024-03-25: 4 mg via INTRAVENOUS
  Filled 2024-03-25: qty 2

## 2024-03-25 MED ORDER — LACTATED RINGERS IV BOLUS
1000.0000 mL | Freq: Once | INTRAVENOUS | Status: AC
  Administered 2024-03-25: 1000 mL via INTRAVENOUS

## 2024-03-25 MED ORDER — LOPERAMIDE HCL 2 MG PO CAPS
4.0000 mg | ORAL_CAPSULE | Freq: Once | ORAL | Status: AC
  Administered 2024-03-25: 4 mg via ORAL
  Filled 2024-03-25: qty 2

## 2024-03-25 MED ORDER — ONDANSETRON 4 MG PO TBDP: 4.0000 mg | ORAL_TABLET | Freq: Three times a day (TID) | ORAL | 0 refills | Status: AC | PRN

## 2024-03-25 NOTE — Discharge Instructions (Addendum)
 Over-the-counter Imodium  Zofran  for any further nausea or vomiting  Plenty of fluids

## 2024-03-25 NOTE — ED Triage Notes (Signed)
 Pt comes in via pov with complaints of diarrhea that started this morning at 4am. Pt states that he was doing work outside yesterday, and feels that he might have gotten over heated. Pt states that he came in and ate a whole bowl of watermelon when he came in. Pt with no fevers, and no vomiting,  but complains of nausea. Pt with no signs of acute distress at this time.

## 2024-03-25 NOTE — ED Provider Notes (Signed)
 [facility] Provider Note    Event Date/Time   First MD Initiated Contact with Patient 03/25/24 1641     (approximate)   History   Diarrhea   HPI  Phillip Santana is a 53 y.o. male who presents to the ED for evaluation of Diarrhea   Review of routine PCP visit from March.  Obese patient with history of prediabetes, started on Wegovy   Patient resents for evaluation of loose stools over the past 24-48 hours.  Reports eating Jodie Edison 2 days ago which frequently gives him diarrhea, then he spent a few hours in the afternoon yesterday (nearly 100 degrees) splitting of firewood, then eating watermelon after this which also often gives him diarrhea.  Reports feeling dizziness and generalized weakness today no syncope or falls.  No abdominal pain.  Still making urine, though lesser volume.  No emesis but did have some nausea.   Physical Exam   Triage Vital Signs: ED Triage Vitals  Encounter Vitals Group     BP 03/25/24 1608 129/85     Girls Systolic BP Percentile --      Girls Diastolic BP Percentile --      Boys Systolic BP Percentile --      Boys Diastolic BP Percentile --      Pulse Rate 03/25/24 1608 98     Resp 03/25/24 1641 18     Temp 03/25/24 1608 98 F (36.7 C)     Temp Source 03/25/24 1608 Oral     SpO2 03/25/24 1608 98 %     Weight 03/25/24 1609 198 lb (89.8 kg)     Height 03/25/24 1609 5' 7 (1.702 m)     Head Circumference --      Peak Flow --      Pain Score 03/25/24 1617 7     Pain Loc --      Pain Education --      Exclude from Growth Chart --     Most recent vital signs: Vitals:   03/25/24 1641 03/25/24 2004  BP:    Pulse:    Resp: 18   Temp:  98.2 F (36.8 C)  SpO2:      General: Awake, no distress.  Seems somewhat dry but well-appearing, pleasant and conversational CV:  Good peripheral perfusion.  Resp:  Normal effort.  Abd:  No distention.  Soft and benign without tenderness, guarding or peritoneal features. MSK:  No  deformity noted.  Neuro:  No focal deficits appreciated. Other:     ED Results / Procedures / Treatments   Labs (all labs ordered are listed, but only abnormal results are displayed) Labs Reviewed  COMPREHENSIVE METABOLIC PANEL WITH GFR - Abnormal; Notable for the following components:      Result Value   Sodium 133 (*)    CO2 17 (*)    Glucose, Bld 124 (*)    Calcium 8.6 (*)    ALT 49 (*)    All other components within normal limits  CBC - Abnormal; Notable for the following components:   RBC 6.41 (*)    Hemoglobin 17.8 (*)    HCT 53.6 (*)    All other components within normal limits  URINALYSIS, ROUTINE W REFLEX MICROSCOPIC - Abnormal; Notable for the following components:   Color, Urine STRAW (*)    APPearance CLEAR (*)    All other components within normal limits  LIPASE, BLOOD    EKG   RADIOLOGY   Official radiology  report(s): No results found.  PROCEDURES and INTERVENTIONS:  Procedures  Medications  lactated ringers bolus 1,000 mL (0 mLs Intravenous Stopped 03/25/24 1853)  ondansetron  (ZOFRAN ) injection 4 mg (4 mg Intravenous Given 03/25/24 1720)  lactated ringers bolus 1,000 mL (0 mLs Intravenous Stopped 03/25/24 2033)  lactated ringers bolus 1,000 mL (0 mLs Intravenous Stopped 03/25/24 2033)  loperamide (IMODIUM) capsule 4 mg (4 mg Oral Given 03/25/24 2043)     IMPRESSION / MDM / ASSESSMENT AND PLAN / ED COURSE  I reviewed the triage vital signs and the nursing notes.  Differential diagnosis includes, but is not limited to, foodborne illness, gastroenteritis, viral syndrome, dehydration, AKI, electrolyte derangement, diverticulitis, SBO  {Patient presents with symptoms of an acute illness or injury that is potentially life-threatening.  Patient presents with 2 days of diarrhea with mild signs of dehydration.  Benign exam without tenderness or guarding.  No abdominal pain.  Normal white count, non-anion gap metabolic acidosis.  Normal lipase.  No  indication for CT imaging at this point.  Feeling much better with IV fluids and antiemetics.  Tolerating p.o. intake.  Awaiting UA.  Suspect suitable for outpatient management.  Clinical Course as of 03/25/24 2101  Tue Mar 25, 2024  2053 Reassessed, feeling much better [DS]  2101 Discussed Imodium, Zofran , expectant management, staying hydrated, return precautions [DS]    Clinical Course User Index [DS] Claudene Rover, MD     FINAL CLINICAL IMPRESSION(S) / ED DIAGNOSES   Final diagnoses:  Diarrhea, unspecified type     Rx / DC Orders   ED Discharge Orders          Ordered    ondansetron  (ZOFRAN -ODT) 4 MG disintegrating tablet  Every 8 hours PRN        03/25/24 2053             Note:  This document was prepared using Dragon voice recognition software and may include unintentional dictation errors.   Claudene Rover, MD 03/25/24 2101

## 2024-05-14 ENCOUNTER — Other Ambulatory Visit: Payer: Self-pay

## 2024-05-14 DIAGNOSIS — Z1211 Encounter for screening for malignant neoplasm of colon: Secondary | ICD-10-CM

## 2024-09-15 ENCOUNTER — Encounter (INDEPENDENT_AMBULATORY_CARE_PROVIDER_SITE_OTHER): Payer: Self-pay | Admitting: Bariatrics
